# Patient Record
Sex: Female | Born: 1970 | Hispanic: No | Marital: Married | Smoking: Never smoker
Health system: Southern US, Community
[De-identification: ages and names within clinical notes are randomized; demographics above are authoritative.]

## PROBLEM LIST (undated history)

## (undated) DIAGNOSIS — E079 Disorder of thyroid, unspecified: Secondary | ICD-10-CM

## (undated) DIAGNOSIS — M8580 Other specified disorders of bone density and structure, unspecified site: Secondary | ICD-10-CM

## (undated) HISTORY — PX: ELBOW FRACTURE SURGERY: SHX616

## (undated) HISTORY — DX: Disorder of thyroid, unspecified: E07.9

## (undated) HISTORY — PX: BREAST SURGERY: SHX581

## (undated) HISTORY — DX: Other specified disorders of bone density and structure, unspecified site: M85.80

---

## 2014-11-18 LAB — HM PAP SMEAR: HM PAP: NEGATIVE

## 2014-11-18 LAB — TSH: TSH: 0.84 u[IU]/mL (ref <=5.90)

## 2014-11-18 LAB — BASIC METABOLIC PANEL: Creatinine: 0.7 mg/dL (ref 0.5–1.1)

## 2015-01-31 ENCOUNTER — Encounter: Payer: Self-pay | Admitting: Family Medicine

## 2015-01-31 ENCOUNTER — Ambulatory Visit (INDEPENDENT_AMBULATORY_CARE_PROVIDER_SITE_OTHER): Payer: Managed Care, Other (non HMO) | Admitting: Family Medicine

## 2015-01-31 VITALS — BP 126/94 | HR 75 | Ht 66.0 in | Wt 181.0 lb

## 2015-01-31 DIAGNOSIS — F418 Other specified anxiety disorders: Secondary | ICD-10-CM

## 2015-01-31 DIAGNOSIS — L600 Ingrowing nail: Secondary | ICD-10-CM | POA: Diagnosis not present

## 2015-01-31 DIAGNOSIS — Z23 Encounter for immunization: Secondary | ICD-10-CM | POA: Diagnosis not present

## 2015-01-31 DIAGNOSIS — F32A Depression, unspecified: Secondary | ICD-10-CM

## 2015-01-31 DIAGNOSIS — R635 Abnormal weight gain: Secondary | ICD-10-CM

## 2015-01-31 DIAGNOSIS — F419 Anxiety disorder, unspecified: Principal | ICD-10-CM

## 2015-01-31 DIAGNOSIS — E039 Hypothyroidism, unspecified: Secondary | ICD-10-CM | POA: Insufficient documentation

## 2015-01-31 DIAGNOSIS — F329 Major depressive disorder, single episode, unspecified: Secondary | ICD-10-CM

## 2015-01-31 MED ORDER — VILAZODONE HCL 10 & 20 MG PO KIT: 1.0000 | PACK | Freq: Every day | ORAL | Status: DC

## 2015-01-31 NOTE — Patient Instructions (Signed)
Thank you for coming in today. I recommend counseling.  Start viibryd.  Return in 2 weeks.  Do a 7 day food log in myfitnesspal and send the results to me.   Generalized Anxiety Disorder Generalized anxiety disorder (GAD) is a mental disorder. It interferes with life functions, including relationships, work, and school. GAD is different from normal anxiety, which everyone experiences at some point in their lives in response to specific life events and activities. Normal anxiety actually helps us prepare for and get through these life events and activities. Normal anxiety goes away after the event or activity is over.  GAD causes anxiety that is not necessarily related to specific events or activities. It also causes excess anxiety in proportion to specific events or activities. The anxiety associated with GAD is also difficult to control. GAD can vary from mild to severe. People with severe GAD can have intense waves of anxiety with physical symptoms (panic attacks).  SYMPTOMS The anxiety and worry associated with GAD are difficult to control. This anxiety and worry are related to many life events and activities and also occur more days than not for 6 months or longer. People with GAD also have three or more of the following symptoms (one or more in children):  Restlessness.   Fatigue.  Difficulty concentrating.   Irritability.  Muscle tension.  Difficulty sleeping or unsatisfying sleep. DIAGNOSIS GAD is diagnosed through an assessment by your health care provider. Your health care provider will ask you questions aboutyour mood,physical symptoms, and events in your life. Your health care provider may ask you about your medical history and use of alcohol or drugs, including prescription medicines. Your health care provider may also do a physical exam and blood tests. Certain medical conditions and the use of certain substances can cause symptoms similar to those associated with GAD. Your  health care provider may refer you to a mental health specialist for further evaluation. TREATMENT The following therapies are usually used to treat GAD:   Medication. Antidepressant medication usually is prescribed for long-term daily control. Antianxiety medicines may be added in severe cases, especially when panic attacks occur.   Talk therapy (psychotherapy). Certain types of talk therapy can be helpful in treating GAD by providing support, education, and guidance. A form of talk therapy called cognitive behavioral therapy can teach you healthy ways to think about and react to daily life events and activities.  Stress managementtechniques. These include yoga, meditation, and exercise and can be very helpful when they are practiced regularly. A mental health specialist can help determine which treatment is best for you. Some people see improvement with one therapy. However, other people require a combination of therapies.   This information is not intended to replace advice given to you by your health care provider. Make sure you discuss any questions you have with your health care provider.   Document Released: 07/20/2012 Document Revised: 04/15/2014 Document Reviewed: 07/20/2012 Elsevier Interactive Patient Education [employer]2016 Elsevier Inc.

## 2015-01-31 NOTE — Assessment & Plan Note (Signed)
Discuss diet and exercise. Bring food log.

## 2015-01-31 NOTE — Progress Notes (Signed)
Christy Burns is a 44 y.o. female who presents to [place]  today for establish care and discussed anxiety, ingrown toenail, weight gain, and foot wound..  1) anxiety: Patient has had anxiety intermittently for years that was mildly present and controlled easily with no treatment. However she's had increased stress at work. She was part of the [employer] team and was laid off when [employer] [facility] bought off of the [employer] [facility]. She has a new employer. Over the past several months to years she's had worsening anxiety. Her previous PCP history her with Lexapro, Topamax, Zoloft, Effexor, Prozac and many other medications which she is not able to tolerate. She has not had Viibryd or Brintellix. She denies any suicidality or homicidality. She has not attended counseling.  2)  ingrown toenail: Patient notes a history of left ingrown great toenails especially on the lateral nail fold. It is not specifically tender inflamed today. She would like it possibly removed.  3) weight gain: Patient notes a history of weight gain over the past few years from 150-180 pounds. She sometimes keeps track of her calories. She does not have time to exercise.  4) stepped on a stable: Patient stepped on a stable on her left foot a few days ago. She notes the wound is quite well and nontender. She states that she is due for a tetanus vaccine.  Patient has a history of hypothyroidism. She notes her TSH was recently checked and normal. Past Medical History  Diagnosis Date  . Thyroid disease    Past Surgical History  Procedure Laterality Date  . Breast surgery    . Elbow fracture surgery     Social History  Substance Use Topics  . Smoking status: Never Smoker   . Smokeless tobacco: Not on file  . Alcohol Use: No   family history includes Depression in her mother; Diabetes in her father; Hearing loss in her maternal grandfather; Hyperlipidemia in her father; Hypertension in her  father.  ROS as above Medications: Current Outpatient Prescriptions  Medication Sig Dispense Refill  . levothyroxine (SYNTHROID, LEVOTHROID) 100 MCG tablet Take 100 mcg by mouth daily.  5  . Vilazodone HCl (VIIBRYD STARTER PACK) 10 & 20 MG KIT Take 1 tablet by mouth daily. Use as directed, 4 weeks QS 1 kit 0   No current facility-administered medications for this visit.   Not on File   Exam:  BP 126/94 mmHg  Pulse 75  Ht 5' 6"  (1.676 m)  Wt 181 lb (82.101 kg)  BMI 29.23 kg/m2 Gen: Well NAD HEENT: EOMI,  MMM Lungs: Normal work of breathing. CTABL Heart: RRR no MRG Abd: NABS, Soft. Nondistended, Nontender Exts: Brisk capillary refill, warm and well perfused.  Left foot ingrown non-infected great toenail with onychomycosis present. Onychomycosis  is present medially in the ingrown portion is present on the medial side. Nontender. The plantar aspect of foot shows a mildly erythematous papule nontender with no surrounding induration or erythema. Psych: Alert and oriented normal fracture patient thought process. PHQ 7 is 18.  PHQ9 is 14.  No results found for this or any previous visit (from the past 24 hour(s)). No results found.   Please see individual assessment and plan sections.  Tdap given prior to discharge.

## 2015-01-31 NOTE — Assessment & Plan Note (Signed)
Symptoms consistent with anxiety and depression. Patient has had treatment failure or side effects with multiple SSRIs and numbness in her eyes. Will try Viibryd. Starter pack. Recheck 2 weeks.

## 2015-01-31 NOTE — Assessment & Plan Note (Signed)
Obtain medical records to review TSH. Continue current regimen.

## 2015-01-31 NOTE — Assessment & Plan Note (Signed)
Return for partial nail removal when necessary

## 2015-02-09 ENCOUNTER — Encounter: Payer: Self-pay | Admitting: Family Medicine

## 2015-02-23 ENCOUNTER — Telehealth: Payer: Self-pay | Admitting: Family Medicine

## 2015-02-23 NOTE — Telephone Encounter (Signed)
Received fax for prior authorization on Viibryd filled out form and [facility]. - CF

## 2015-03-20 ENCOUNTER — Emergency Department (INDEPENDENT_AMBULATORY_CARE_PROVIDER_SITE_OTHER)
Admission: EM | Admit: 2015-03-20 | Discharge: 2015-03-20 | Disposition: A | Discharge status: Home / Self Care | Payer: Managed Care, Other (non HMO) | Source: Home / Self Care | Attending: Family Medicine | Admitting: Family Medicine

## 2015-03-20 ENCOUNTER — Encounter: Payer: Self-pay | Admitting: Emergency Medicine

## 2015-03-20 DIAGNOSIS — H6504 Acute serous otitis media, recurrent, right ear: Secondary | ICD-10-CM | POA: Diagnosis not present

## 2015-03-20 MED ORDER — AMOXICILLIN 875 MG PO TABS: 875.0000 mg | ORAL_TABLET | Freq: Two times a day (BID) | ORAL | Status: DC

## 2015-03-20 MED ORDER — PREDNISONE 20 MG PO TABS: ORAL_TABLET | ORAL | Status: DC

## 2015-03-20 NOTE — ED Provider Notes (Signed)
CSN: [id number]     Arrival date & time 03/20/15  1713 History   First MD Initiated Contact with Patient 03/20/15 1719     Chief Complaint  Patient presents with  . Ear Problem   (Consider location/radiation/quality/duration/timing/severity/associated sxs/prior Treatment) HPI Pt is a 44yo female with hx of recurrent ear infections, presenting to [facility] with c/o Right sided ear pain and "fullness" that stared 4 days ago, associated nasal congestion, sore throat, and mild intermittent non-productive cough.  Pt states she has lots of scarring in her Right ear and it gets infected often. Last episode of AOM in her Right ear was about 6 months ago.  Pt states amoxicillin and prednisone have worked well in the past. She did not have PE tubes in her ears as a child and has not f/u with ENT as an adult. She does use Flonase daily. Denies sick contacts or recent travel. Denies fever or chills.   Past Medical History  Diagnosis Date  . Thyroid disease    Past Surgical History  Procedure Laterality Date  . Breast surgery    . Elbow fracture surgery     Family History  Problem Relation Age of Onset  . Depression Mother   . Diabetes Father   . Hyperlipidemia Father   . Hypertension Father   . Hearing loss Maternal Grandfather    Social History  Substance Use Topics  . Smoking status: Never Smoker   . Smokeless tobacco: None  . Alcohol Use: No   OB History    No data available     Review of Systems  Constitutional: Negative for fever and chills.  HENT: Positive for congestion, ear pain (right) and sore throat. Negative for trouble swallowing and voice change.   Respiratory: Positive for cough. Negative for shortness of breath.   Cardiovascular: Negative for chest pain and palpitations.  Gastrointestinal: Positive for nausea. Negative for vomiting, abdominal pain and diarrhea.  Musculoskeletal: Negative for myalgias, back pain and arthralgias.  Skin: Negative for rash.  Neurological:  Positive for headaches. Negative for dizziness and light-headedness.  All other systems reviewed and are negative.   Allergies  Review of patient's allergies indicates no known allergies.  Home Medications   Prior to Admission medications   Medication Sig Start Date End Date Taking? Authorizing Provider  amoxicillin (AMOXIL) 875 MG tablet Take 1 tablet (875 mg total) by mouth 2 (two) times daily. For 10 days 03/20/15   Noland Fordyce, PA-C  levothyroxine (SYNTHROID, LEVOTHROID) 100 MCG tablet Take 100 mcg by mouth daily. 12/25/14   Historical Provider, MD  predniSONE (DELTASONE) 20 MG tablet 3 tabs po day one, then 2 po daily x 4 days 03/20/15   Noland Fordyce, PA-C  Vilazodone HCl (VIIBRYD STARTER PACK) 10 & 20 MG KIT Take 1 tablet by mouth daily. Use as directed, 4 weeks QS 01/31/15   Gregor Hams, MD   Meds Ordered and Administered this Visit  Medications - No data to display  BP 110/77 mmHg  Pulse 69  Temp(Src) 98 F (36.7 C) (Oral)  Ht 5' 7"  (1.702 m)  Wt 184 lb (83.462 kg)  BMI 28.81 kg/m2  SpO2 97% No data found.   Physical Exam  Constitutional: She appears well-developed and well-nourished. No distress.  HENT:  Head: Normocephalic and atraumatic.  Right Ear: Hearing and ear canal normal. There is tenderness. Tympanic membrane is scarred, erythematous and bulging. A middle ear effusion is present.  Left Ear: Hearing, tympanic membrane, external ear and  ear canal normal.  Nose: Nose normal.  Mouth/Throat: Uvula is midline and mucous membranes are normal. Posterior oropharyngeal erythema present. No oropharyngeal exudate, posterior oropharyngeal edema or tonsillar abscesses.  Eyes: Conjunctivae are normal. No scleral icterus.  Neck: Normal range of motion. Neck supple.  Cardiovascular: Normal rate, regular rhythm and normal heart sounds.   Pulmonary/Chest: Effort normal and breath sounds normal. No respiratory distress. She has no wheezes. She has no rales. She exhibits no  tenderness.  Abdominal: Soft. She exhibits no distension and no mass. There is no tenderness. There is no rebound and no guarding.  Musculoskeletal: Normal range of motion.  Lymphadenopathy:    She has no cervical adenopathy.  Neurological: She is alert.  Skin: Skin is warm and dry. She is not diaphoretic.  Nursing note and vitals reviewed.   ED Course  Procedures (including critical care time)  Labs Review Labs Reviewed - No data to display  Imaging Review No results found.   MDM   1. Recurrent acute serous otitis media of right ear    Pt c/o Right ear pain. Hx of recurrent infections in same ear.   Exam c/w Right AOM  Rx: amoxicillin 874m BID for 10 days, prednisone   Advised pt to use acetaminophen and ibuprofen as needed for fever and pain. Encouraged rest and fluids. F/u with PCP in 7-10 days if not improving, sooner if worsening. Pt verbalized understanding and agreement with tx plan.     ENoland Fordyce PA-C 03/20/15 [phone]

## 2015-03-20 NOTE — Discharge Instructions (Signed)
You may take 400-600mg Ibuprofen (Motrin) every 6-8 hours for fever and pain  °Alternate with Tylenol  °You may take 500mg Tylenol every 4-6 hours as needed for fever and pain  °Follow-up with your primary care provider next week for recheck of symptoms if not improving.  °Be sure to drink plenty of fluids and rest, at least 8hrs of sleep a night, preferably more while you are sick. °Return urgent care or go to closest ER if you cannot keep down fluids/signs of dehydration, fever not reducing with Tylenol, difficulty breathing/wheezing, stiff neck, worsening condition, or other concerns (see below)  °Please take antibiotics as prescribed and be sure to complete entire course even if you start to feel better to ensure infection does not come back. ° ° °Otitis Media With Effusion °Otitis media with effusion is the presence of fluid in the middle ear. This is a common problem in children, which often follows ear infections. It may be present for weeks or longer after the infection. Unlike an acute ear infection, otitis media with effusion refers only to fluid behind the ear drum and not infection. Children with repeated ear and sinus infections and allergy problems are the most likely to get otitis media with effusion. °CAUSES  °The most frequent cause of the fluid buildup is dysfunction of the eustachian tubes. These are the tubes that drain fluid in the ears to the back of the nose (nasopharynx). °SYMPTOMS  °· The main symptom of this condition is hearing loss. As a result, you or your child may: °¨ Listen to the TV at a loud volume. °¨ Not respond to questions. °¨ Ask "what" often when spoken to. °¨ Mistake or confuse one sound or word for another. °· There may be a sensation of fullness or pressure but usually not pain. °DIAGNOSIS  °· Your health care provider will diagnose this condition by examining you or your child's ears. °· Your health care provider may test the pressure in you or your child's ear with a  tympanometer. °· A hearing test may be conducted if the problem persists. °TREATMENT  °· Treatment depends on the duration and the effects of the effusion. °· Antibiotics, decongestants, nose drops, and cortisone-type drugs (tablets or nasal spray) may not be helpful. °· Children with persistent ear effusions may have delayed language or behavioral problems. Children at risk for developmental delays in hearing, learning, and speech may require referral to a specialist earlier than children not at risk. °· You or your child's health care provider may suggest a referral to an ear, nose, and throat surgeon for treatment. The following may help restore normal hearing: °¨ Drainage of fluid. °¨ Placement of ear tubes (tympanostomy tubes). °¨ Removal of adenoids (adenoidectomy). °HOME CARE INSTRUCTIONS  °· Avoid secondhand smoke. °· Infants who are breastfed are less likely to have this condition. °· Avoid feeding infants while they are lying flat. °· Avoid known environmental allergens. °· Avoid people who are sick. °SEEK MEDICAL CARE IF:  °· Hearing is not better in 3 months. °· Hearing is worse. °· Ear pain. °· Drainage from the ear. °· Dizziness. °MAKE SURE YOU:  °· Understand these instructions. °· Will watch your condition. °· Will get help right away if you are not doing well or get worse. °  °This information is not intended to replace advice given to you by your health care provider. Make sure you discuss any questions you have with your health care provider. °  °Document Released: 05/02/2004 Document   Revised: 04/15/2014 Document Reviewed: 10/20/2012 °Elsevier Interactive Patient Education ©2016 Elsevier Inc. ° °

## 2015-03-20 NOTE — ED Notes (Signed)
Rt Ear clogged, congestion, nausea, headache x 4 days

## 2015-03-21 ENCOUNTER — Ambulatory Visit: Payer: Managed Care, Other (non HMO) | Admitting: Family Medicine

## 2015-03-24 ENCOUNTER — Telehealth: Payer: Self-pay

## 2015-03-24 NOTE — ED Notes (Signed)
Spoke on telephone today.  Pt called first time asking for a 12 day script for prednisone.  Provider- E. Gershon MusselO'Malley gave a 5 day dose when seen on 12/12.  Spoke with provider, and she said for patient to finish antibiotic, that the prednisone supply is a tapered dosage and provider preference.  Called patient back and explained what provider stated, and she became angry and said that we only treat the problem and do not care about the patient.  Nurse stated that per provider, she needed to finish antibiotic, and follow up with PCP if still having problems.  Pt stated that she is unable to get an appointment with provider. Pt called back a second time and spoke with registration, and said she was very upset about meds and that she had called several times today and no one had returned her call.  When I called her back, she said that she had talked with me earlier, and she was looking at her prescriptions, and the provider gave her amoxil, instead of Augmentin.  When I spoke with the provider, in her notes she said that Amoxil and prednisone is what works best for the patients ear infections.  So she prescribed those 2 medications.  The provider also told the patient to follow up with ENT because of the recurrent ear infections.  The patient was very angry and asked to speak to my supervisor.

## 2015-04-09 HISTORY — PX: WRIST SURGERY: SHX841

## 2015-06-19 ENCOUNTER — Ambulatory Visit (INDEPENDENT_AMBULATORY_CARE_PROVIDER_SITE_OTHER): Payer: Managed Care, Other (non HMO)

## 2015-06-19 ENCOUNTER — Encounter: Payer: Self-pay | Admitting: Family Medicine

## 2015-06-19 ENCOUNTER — Ambulatory Visit (INDEPENDENT_AMBULATORY_CARE_PROVIDER_SITE_OTHER): Payer: Managed Care, Other (non HMO) | Admitting: Family Medicine

## 2015-06-19 VITALS — BP 136/87 | HR 82 | Wt 184.0 lb

## 2015-06-19 DIAGNOSIS — M79671 Pain in right foot: Secondary | ICD-10-CM | POA: Diagnosis not present

## 2015-06-19 NOTE — Assessment & Plan Note (Signed)
Possible partial tear of the lateral portion of the plantar fascia or ligament tear or strain. X-ray pending. Plan for cam walker boot weightbearing as tolerated. Recheck in 2 weeks

## 2015-06-19 NOTE — Progress Notes (Signed)
   Christy Burns is a 45 y.o. female who presents [facility] today for right heel pain. Patient was in her normal state of health on Friday 3 days ago. She was doing a high intensity interval training class when she landed on her right foot and felt a pop in the lateral plantar calcaneus area. She notes pain and difficulty bearing weight. She has tried ice rest and ibuprofen which have helped only a little. She denies any radiating pain weakness or numbness fevers or chills.    Past Medical History  Diagnosis Date  . Thyroid disease    Past Surgical History  Procedure Laterality Date  . Breast surgery    . Elbow fracture surgery     Social History  Substance Use Topics  . Smoking status: Never Smoker   . Smokeless tobacco: Not on file  . Alcohol Use: No   family history includes Depression in her mother; Diabetes in her father; Hearing loss in her maternal grandfather; Hyperlipidemia in her father; Hypertension in her father.  ROS:  No headache, visual changes, nausea, vomiting, diarrhea, constipation, dizziness, abdominal pain, skin rash, fevers, chills, night sweats, weight loss, swollen lymph nodes, body aches, joint swelling, muscle aches, chest pain, shortness of breath, mood changes, visual or auditory hallucinations.    Medications: Current Outpatient Prescriptions  Medication Sig Dispense Refill  . levothyroxine (SYNTHROID, LEVOTHROID) 100 MCG tablet Take 100 mcg by mouth daily.  5   No current facility-administered medications for this visit.   No Known Allergies   Exam:  BP 136/87 mmHg  Pulse 82  Wt 184 lb (83.462 kg) General: Well Developed, well nourished, and in no acute distress.  Neuro/Psych: Alert and oriented x3, extra-ocular muscles intact, able to move all 4 extremities, sensation grossly intact. Skin: Warm and dry, no rashes noted.  Respiratory: Not using accessory muscles, speaking in full sentences,  trachea midline.  Cardiovascular: Pulses palpable, no extremity edema. Abdomen: Does not appear distended. MSK: Right foot is normal-appearing without any obvious swelling or ecchymosis. Tender to palpation at the plantar lateral calcaneus and into the cuboid. Nontender at the base of the fifth metatarsal. Foot is otherwise nontender. Normal ankle motion pulses capillary refill and sensation. Antalgic gait  X-ray right foot: No obvious bony abnormalities visualized in the area of tenderness.  No results found for this or any previous visit (from the past 24 hour(s)). No results found.   Please see individual assessment and plan sections.

## 2015-06-19 NOTE — Patient Instructions (Signed)
Thank you for coming in today. Return in 2 weeks.  Use the boot,.  Do not drive while wearing the boot.

## 2015-06-20 NOTE — Progress Notes (Signed)
Quick Note:  Normal, no changes. ______ 

## 2015-07-03 ENCOUNTER — Ambulatory Visit: Payer: Managed Care, Other (non HMO) | Admitting: Family Medicine

## 2015-07-11 ENCOUNTER — Ambulatory Visit: Payer: Managed Care, Other (non HMO) | Admitting: Family Medicine

## 2015-07-27 ENCOUNTER — Encounter: Payer: Self-pay | Admitting: Family Medicine

## 2015-07-27 ENCOUNTER — Ambulatory Visit (INDEPENDENT_AMBULATORY_CARE_PROVIDER_SITE_OTHER): Payer: Managed Care, Other (non HMO) | Admitting: Family Medicine

## 2015-07-27 VITALS — BP 121/82 | HR 92 | Wt 179.0 lb

## 2015-07-27 DIAGNOSIS — M79671 Pain in right foot: Secondary | ICD-10-CM | POA: Diagnosis not present

## 2015-07-27 NOTE — Assessment & Plan Note (Signed)
Improving.  Her eccentric exercises. Continue ibuprofen and icing. I think the left foot and left knee pain is likely due to abnormal gait. Wean to shoe and recheck in about a month

## 2015-07-27 NOTE — Progress Notes (Signed)
       Christy Burns is a 45 y.o. female who [facility] for follow-up right heel pain. Patient was seen several weeks ago for right heel pain thought to be a partial plantar fascia rupture. She's been using a cam walker boot and notes that she is doing much better with minimal pain in her right heel. She has started weaning to a regular shoe. However she notes her left knee and left foot are hurting as well. She is using ibuprofen which helps. She thinks she is compensating use her left leg more because her right leg is hurting.   Past Medical History  Diagnosis Date  . Thyroid disease    Past Surgical History  Procedure Laterality Date  . Breast surgery    . Elbow fracture surgery     Social History  Substance Use Topics  . Smoking status: Never Smoker   . Smokeless tobacco: Not on file  . Alcohol Use: No   family history includes Depression in her mother; Diabetes in her father; Hearing loss in her maternal grandfather; Hyperlipidemia in her father; Hypertension in her father.  ROS as above Medications: Current Outpatient Prescriptions  Medication Sig Dispense Refill  . levothyroxine (SYNTHROID, LEVOTHROID) 100 MCG tablet Take 100 mcg by mouth daily.  5   No current facility-administered medications for this visit.   No Known Allergies   Exam:  BP 121/82 mmHg  Pulse 92  Wt 179 lb (81.194 kg) Gen: Well NAD HEENT: EOMI,  MMM Lungs: Normal work of breathing. CTABL Heart: RRR no MRG Abd: NABS, Soft. Nondistended, Nontender Exts: Brisk capillary refill, warm and well perfused.  Left knee mild effusion normal motion nontender stable ligaments exam Right foot normal appearing minimally tender lateral plantar calcaneus. Normal foot motion.  No results found for this or any previous visit (from the past 24 hour(s)). No results found.   Please see individual  assessment and plan sections.

## 2015-07-27 NOTE — Patient Instructions (Signed)
Thank you for coming in today. Wean out of the boot.  Do the exercises we discussed.  Return sooner if needed.  Return in 4 weeks.

## 2015-08-24 ENCOUNTER — Ambulatory Visit (INDEPENDENT_AMBULATORY_CARE_PROVIDER_SITE_OTHER): Payer: Managed Care, Other (non HMO) | Admitting: Family Medicine

## 2015-08-24 DIAGNOSIS — Z5329 Procedure and treatment not carried out because of patient's decision for other reasons: Secondary | ICD-10-CM

## 2015-08-25 NOTE — Progress Notes (Signed)
No show. Follow up soon.  

## 2015-10-05 ENCOUNTER — Ambulatory Visit (INDEPENDENT_AMBULATORY_CARE_PROVIDER_SITE_OTHER): Payer: Managed Care, Other (non HMO) | Admitting: Family Medicine

## 2015-10-05 ENCOUNTER — Encounter: Payer: Self-pay | Admitting: Family Medicine

## 2015-10-05 VITALS — BP 139/79 | HR 79 | Ht 66.0 in | Wt 171.0 lb

## 2015-10-05 DIAGNOSIS — M79671 Pain in right foot: Secondary | ICD-10-CM

## 2015-10-05 DIAGNOSIS — Z0000 Encounter for general adult medical examination without abnormal findings: Secondary | ICD-10-CM | POA: Diagnosis not present

## 2015-10-05 DIAGNOSIS — E039 Hypothyroidism, unspecified: Secondary | ICD-10-CM | POA: Diagnosis not present

## 2015-10-05 NOTE — Patient Instructions (Signed)
Thank you for coming in today. Get fasting labs soon,.  Return for orthotics.

## 2015-10-05 NOTE — Progress Notes (Signed)
       Christy Burns is a 45 y.o. female who [facility] today for well visit. Patient is doing quite well and aside from her foot pain is essentially asymptomatic. She receives her gynecologic care via OB/GYN. Her cervical cancer screening is up-to-date. She has not yet had breast cancer screening because she has breast implants and the right breast implant is leaking. She's been told not to have mammograms until she can get it fixed which will cost around $7000.  She notes that her hypothyroidism is doing well. She takes levothyroxine daily and denies feeling too hot or too cold or skin changes.  However she notes her feet are still very painful. She's been treated for bilateral plantar fasciitis with home exercise program and icing which has not helped at all.   Past Medical History  Diagnosis Date  . Thyroid disease    Past Surgical History  Procedure Laterality Date  . Breast surgery    . Elbow fracture surgery     Social History  Substance Use Topics  . Smoking status: Never Smoker   . Smokeless tobacco: Not on file  . Alcohol Use: No   family history includes Depression in her mother; Diabetes in her father; Hearing loss in her maternal grandfather; Hyperlipidemia in her father; Hypertension in her father.  ROS as above:  Medications: Current Outpatient Prescriptions  Medication Sig Dispense Refill  . levothyroxine (SYNTHROID, LEVOTHROID) 100 MCG tablet Take 100 mcg by mouth daily.  5  . phentermine 37.5 MG capsule Take 37.5 mg by mouth every morning.     No current facility-administered medications for this visit.   No Known Allergies   Exam:  BP 139/79 mmHg  Pulse 79  Ht 5\' 6"  (1.676 m)  Wt 171 lb (77.565 kg)  BMI 27.61 kg/m2 Gen: Well NAD HEENT: EOMI,  MMM Lungs: Normal work of breathing. CTABL Heart: RRR no MRG Abd: NABS, Soft.  Nondistended, Nontender Exts: Brisk capillary refill, warm and well perfused.  Feet bilaterally normal-appearing. Tender palpation bilateral plantar calcaneus. Antalgic gait.  No results found for this or any previous visit (from the past 24 hour(s)). No results found.    Assessment and Plan: 45 y.o. female with  Will visit: Doing quite well. Cancer screening is up-to-date. We'll research what to do for breast cancer screening for this patient. I suspect breast MRI is probably her best option that this may be limited by insurance.  We'll additionally obtain basic fasting labs and HIV test as part of her wellness visit today.  History of her bilateral plantar fasciitis will have patient return for orthotics in the near future.  Discussed warning signs or symptoms. Please see discharge instructions. Patient expresses understanding.

## 2015-10-19 ENCOUNTER — Ambulatory Visit (INDEPENDENT_AMBULATORY_CARE_PROVIDER_SITE_OTHER): Payer: Managed Care, Other (non HMO) | Admitting: Family Medicine

## 2015-10-19 VITALS — BP 122/83 | HR 94

## 2015-10-19 DIAGNOSIS — M79671 Pain in right foot: Secondary | ICD-10-CM

## 2015-10-19 NOTE — Progress Notes (Signed)
          Orthotics Note:   Patient was fitted for a : standard, cushioned, semi-rigid orthotic. The orthotic was heated and afterward the patient stood on the orthotic blank positioned on the orthotic stand. The patient was positioned in subtalar neutral position and 10 degrees of ankle dorsiflexion in a weight bearing stance. After completion of molding, a stable base was applied to the orthotic blank. The blank was ground to a stable position for weight bearing. Size: 9 left trimmed short to fit in shoe better.  Base: The [employer] EVA Additional Posting and Padding: None The patient ambulated these, and they were very comfortable.  I spent 40 minutes with this patient, greater than 50% was face-to-face time counseling regarding the below diagnosis.

## 2015-10-19 NOTE — Patient Instructions (Signed)
Thank you for coming in today. Return in a few weeks if not better.  Continue the exercises.

## 2015-12-22 LAB — COMPREHENSIVE METABOLIC PANEL
ALK PHOS: 55 U/L (ref 33–115)
ALT: 19 U/L (ref 6–29)
AST: 14 U/L (ref 10–35)
Albumin: 4.5 g/dL (ref 3.6–5.1)
BUN: 14 mg/dL (ref 7–25)
CO2: 26 mmol/L (ref 20–31)
CREATININE: 0.81 mg/dL (ref 0.50–1.10)
Calcium: 9.6 mg/dL (ref 8.6–10.2)
Chloride: 104 mmol/L (ref 98–110)
GLUCOSE: 94 mg/dL (ref 65–99)
POTASSIUM: 4.4 mmol/L (ref 3.5–5.3)
SODIUM: 140 mmol/L (ref 135–146)
TOTAL PROTEIN: 6.9 g/dL (ref 6.1–8.1)
Total Bilirubin: 0.6 mg/dL (ref 0.2–1.2)

## 2015-12-22 LAB — CBC
HCT: 38.6 % (ref 35.0–45.0)
Hemoglobin: 13.3 g/dL (ref 11.7–15.5)
MCH: 30.1 pg (ref 27.0–33.0)
MCHC: 34.5 g/dL (ref 32.0–36.0)
MCV: 87.3 fL (ref 80.0–100.0)
MPV: 11.2 fL (ref 7.5–12.5)
PLATELETS: 262 10*3/uL (ref 140–400)
RBC: 4.42 MIL/uL (ref 3.80–5.10)
RDW: 12.4 % (ref 11.0–15.0)
WBC: 3.9 10*3/uL (ref 3.8–10.8)

## 2015-12-22 LAB — LIPID PANEL
Cholesterol: 210 mg/dL — ABNORMAL HIGH (ref 125–200)
HDL: 78 mg/dL (ref >=46)
LDL CALC: 112 mg/dL (ref <130)
TRIGLYCERIDES: 101 mg/dL (ref <150)
Total CHOL/HDL Ratio: 2.7 Ratio (ref <=5.0)
VLDL: 20 mg/dL (ref <30)

## 2015-12-22 LAB — TSH: TSH: 0.07 mIU/L — ABNORMAL LOW

## 2015-12-23 LAB — HEMOGLOBIN A1C
Hgb A1c MFr Bld: 5.1 % (ref <5.7)
Mean Plasma Glucose: 100 mg/dL

## 2015-12-23 LAB — VITAMIN D 25 HYDROXY (VIT D DEFICIENCY, FRACTURES): Vit D, 25-Hydroxy: 23 ng/mL — ABNORMAL LOW (ref 30–100)

## 2015-12-23 LAB — HIV ANTIBODY (ROUTINE TESTING W REFLEX): HIV: NONREACTIVE

## 2015-12-25 ENCOUNTER — Other Ambulatory Visit: Payer: Self-pay | Admitting: Family Medicine

## 2015-12-25 MED ORDER — LEVOTHYROXINE SODIUM 88 MCG PO TABS: 88.0000 ug | ORAL_TABLET | Freq: Every day | ORAL | 0 refills | Status: DC

## 2015-12-27 ENCOUNTER — Ambulatory Visit (INDEPENDENT_AMBULATORY_CARE_PROVIDER_SITE_OTHER): Payer: Managed Care, Other (non HMO) | Admitting: Family Medicine

## 2015-12-27 ENCOUNTER — Encounter: Payer: Self-pay | Admitting: Family Medicine

## 2015-12-27 VITALS — BP 118/73 | HR 74 | Ht 66.0 in

## 2015-12-27 DIAGNOSIS — R7989 Other specified abnormal findings of blood chemistry: Secondary | ICD-10-CM

## 2015-12-27 DIAGNOSIS — E039 Hypothyroidism, unspecified: Secondary | ICD-10-CM | POA: Diagnosis not present

## 2015-12-27 DIAGNOSIS — H6981 Other specified disorders of Eustachian tube, right ear: Secondary | ICD-10-CM

## 2015-12-27 DIAGNOSIS — R946 Abnormal results of thyroid function studies: Secondary | ICD-10-CM

## 2015-12-27 DIAGNOSIS — M79671 Pain in right foot: Secondary | ICD-10-CM

## 2015-12-27 MED ORDER — FLUTICASONE PROPIONATE 50 MCG/ACT NA SUSP: 2.0000 | Freq: Every day | NASAL | 2 refills | Status: AC

## 2015-12-27 MED ORDER — LORATADINE 10 MG PO TABS: 10.0000 mg | ORAL_TABLET | Freq: Every day | ORAL | 3 refills | Status: DC

## 2015-12-27 NOTE — Patient Instructions (Signed)
Thank you for coming in today. Follow up with podiatry for orthotics. Get repeat labs.  Return as needed.  Use flonase and Claritin + D   Barotitis Media Barotitis media is inflammation of your middle ear. This occurs when the auditory tube (eustachian tube) leading from the back of your nose (nasopharynx) to your eardrum is blocked. This blockage may result from a cold, environmental allergies, or an upper respiratory infection. Unresolved barotitis media may lead to damage or hearing loss (barotrauma), which may become permanent. HOME CARE INSTRUCTIONS   Use medicines as recommended by your health care provider. Over-the-counter medicines will help unblock the canal and can help during times of air travel.  Do not put anything into your ears to clean or unplug them. Eardrops will not be helpful.  Do not swim, dive, or fly until your health care provider says it is all right to do so. If these activities are necessary, chewing gum with frequent, forceful swallowing may help. It is also helpful to hold your nose and gently blow to pop your ears for equalizing pressure changes. This forces air into the eustachian tube.  Only take over-the-counter or prescription medicines for pain, discomfort, or fever as directed by your health care provider.  A decongestant may be helpful in decongesting the middle ear and make pressure equalization easier. SEEK MEDICAL CARE IF:  You experience a serious form of dizziness in which you feel as if the room is spinning and you feel nauseated (vertigo).  Your symptoms only involve one ear. SEEK IMMEDIATE MEDICAL CARE IF:   You develop a severe headache, dizziness, or severe ear pain.  You have bloody or pus-like drainage from your ears.  You develop a fever.  Your problems do not improve or become worse. MAKE SURE YOU:   Understand these instructions.  Will watch your condition.  Will get help right away if you are not doing well or get worse.   This information is not intended to replace advice given to you by your health care provider. Make sure you discuss any questions you have with your health care provider.   Document Released: 03/22/2000 Document Revised: 01/13/2013 Document Reviewed: 10/20/2012 Elsevier Interactive Patient Education [employer] Elsevier Inc.

## 2015-12-27 NOTE — Progress Notes (Signed)
       Christy Burns is a 45 y.o. female who [facility] today for follow-up foot pain. Patient was seen a few months ago for left posterior calcaneus pain. This is due to Achilles tendinitis. She's done well with exercises however a pair of custom semirigid orthotics were made 2 months ago. She notes they are not comfortable. She would like a new pair made if possible.   Additionally patient had recent labs as part of a biometric screening for work. She has a history of hypothyroidism and currently takes levothyroxine 88 g daily. Her TSH was low. She feels well. She does not feel too hot or too cold fevers chills nausea vomiting diarrhea. She denies any hair or skin changes.  Past Medical History:  Diagnosis Date  . Thyroid disease    Past Surgical History:  Procedure Laterality Date  . BREAST SURGERY    . ELBOW FRACTURE SURGERY     Social History  Substance Use Topics  . Smoking status: Never Smoker  . Smokeless tobacco: Not on file  . Alcohol use No   family history includes Depression in her mother; Diabetes in her father; Hearing loss in her maternal grandfather; Hyperlipidemia in her father; Hypertension in her father.  ROS as above:  Medications: Current Outpatient Prescriptions  Medication Sig Dispense Refill  . levothyroxine (SYNTHROID, LEVOTHROID) 88 MCG tablet Take 1 tablet (88 mcg total) by mouth daily. 90 tablet 0  . phentermine 37.5 MG capsule Take 37.5 mg by mouth every morning.    . fluticasone (FLONASE) 50 MCG/ACT nasal spray Place 2 sprays into both nostrils daily. 16 g 2  . loratadine (CLARITIN) 10 MG tablet Take 1 tablet (10 mg total) by mouth daily. 90 tablet 3   No current facility-administered medications for this visit.    No Known Allergies   Exam:  BP 118/73   Pulse 74   Ht 5\' 6"  (1.676 m)  Gen: Well NAD HEENT: EOMI,  MMM  No goiter Lungs: Normal work of breathing. CTABL Heart: RRR no MRG Abd: NABS, Soft. Nondistended, Nontender Exts: Brisk capillary refill, warm and well perfused.  Left foot is unremarkable appearing. Mildly tender palpation posterior calcaneus.  Lab Results  Component Value Date   TSH 0.07 (L) 12/22/2015     No results found for this or any previous visit (from the past 24 hour(s)). No results found.    Assessment and Plan: 45 y.o. female with left posterior ankle pain likely due to insertional Achilles tendinitis.  The orthotics appear to be well made. I suspect she simply does not tolerate the style of orthotics that I make. I recommended she follow-up with podiatry to see if a different orthotic technique may be helpful.  Additionally she has decreased TSH. We'll recheck this level along with free T3 and free T4. If still low recommend decreasing levothyroxine.   Orders Placed This Encounter  Procedures  . T4, free  . T3, free  . TSH  . Ambulatory referral to Podiatry    Referral Priority:   Routine    Referral Type:   Consultation    Referral Reason:   Specialty Services Required    Requested Specialty:   Podiatry    Number of Visits Requested:   1    Discussed warning signs or symptoms. Please see discharge instructions. Patient expresses understanding.

## 2016-01-01 ENCOUNTER — Encounter: Payer: Self-pay | Admitting: Family Medicine

## 2016-01-01 MED ORDER — PREDNISONE 5 MG (48) PO TBPK: ORAL_TABLET | ORAL | 0 refills | Status: DC

## 2016-01-22 ENCOUNTER — Encounter: Payer: Self-pay | Admitting: Family Medicine

## 2016-01-29 ENCOUNTER — Encounter: Payer: Self-pay | Admitting: Podiatry

## 2016-01-29 ENCOUNTER — Ambulatory Visit (INDEPENDENT_AMBULATORY_CARE_PROVIDER_SITE_OTHER): Payer: Managed Care, Other (non HMO) | Admitting: Podiatry

## 2016-01-29 ENCOUNTER — Ambulatory Visit: Payer: Self-pay | Admitting: Podiatry

## 2016-01-29 VITALS — BP 136/100 | HR 79 | Ht 67.0 in | Wt 169.0 lb

## 2016-01-29 DIAGNOSIS — M722 Plantar fascial fibromatosis: Secondary | ICD-10-CM | POA: Diagnosis not present

## 2016-01-29 DIAGNOSIS — M21969 Unspecified acquired deformity of unspecified lower leg: Secondary | ICD-10-CM

## 2016-01-29 DIAGNOSIS — M216X1 Other acquired deformities of right foot: Secondary | ICD-10-CM | POA: Diagnosis not present

## 2016-01-29 DIAGNOSIS — M216X2 Other acquired deformities of left foot: Secondary | ICD-10-CM | POA: Diagnosis not present

## 2016-01-29 NOTE — Progress Notes (Signed)
SUBJECTIVE: 45 y.o. year old female presents stating painful heels right > left since March 2017.  Been using Ibuprofen, icing, stretching, and orthotics. She is not wearing orthotics since they are not comfortable for her.  Works at [employer] one day a week. 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  Pain is at plantar center heel and feels tendons are pulling back to the back of ankl area. R>L. Daily exercise for one hour.  Patient is referred by Dr. Denyse Amassorey.   REVIEW OF SYSTEMS: Pertinent items noted in HPI and remainder of comprehensive ROS otherwise negative.  OBJECTIVE: DERMATOLOGIC EXAMINATION: No abnormal skin lesions noted.   VASCULAR EXAMINATION OF LOWER LIMBS: All pedal pulses are palpable with normal pulsation.  No edema or erythema noted. Temperature gradient from tibial crest to dorsum of foot is within normal bilateral.  NEUROLOGIC EXAMINATION OF THE LOWER LIMBS: All epicritic and tactile sensations grossly intact.   MUSCULOSKELETAL EXAMINATION: Ligamentous laxity bilateral. Positive for excess sagittal plane motion of the first ray bilateral. Elevated first ray upon loading of forefoot. Excess STJ pronation upon weight bearing.  ASSESSMENT: Plantar fasciitis R>L. Ligamentous laxity bilateral lower limb. Elevated and Hypermobile first metatarsal bilateral. STJ excess pronation and forefoot abduction with weight bearing bilateral.   PLAN: Reviewed clinical findings, biomechanical aspect of foot function, and available treatment options, NSAIA, exercise, proper shoe gear, metatarsal binder, Night Splint, custom orthotics, and surgical options.  Metatarsal binder x 2 dispensed to provide added stability of the first ray.  Both feet casted for orthotics. Night splint dispensed to use at night.  Will call when they are ready.

## 2016-01-29 NOTE — Patient Instructions (Addendum)
Seen for heel pain R>L. Reviewed biomechanical aspect of foot function.  Metatarsal binder dispensed.  Both feet casted for orthotics. Night splint dispensed to use at night.  Will call when they are ready.

## 2016-03-12 ENCOUNTER — Ambulatory Visit (INDEPENDENT_AMBULATORY_CARE_PROVIDER_SITE_OTHER): Payer: Managed Care, Other (non HMO) | Admitting: Podiatry

## 2016-03-12 ENCOUNTER — Encounter: Payer: Self-pay | Admitting: Podiatry

## 2016-03-12 DIAGNOSIS — M722 Plantar fascial fibromatosis: Secondary | ICD-10-CM

## 2016-03-12 NOTE — Progress Notes (Signed)
One month orthotic check. Heel area cause to pitches forward. Arches are not setting in right spot. No feeling of supporting to arch area. None of shoes are fitting or accommodating. Wants to wear regular tennis shoes.

## 2016-03-12 NOTE — Patient Instructions (Signed)
Orthotics are not working. Need to make adjustment. Will call when they are ready.

## 2016-04-02 ENCOUNTER — Ambulatory Visit: Payer: Managed Care, Other (non HMO) | Admitting: Podiatry

## 2016-05-12 ENCOUNTER — Other Ambulatory Visit: Payer: Self-pay | Admitting: Family Medicine

## 2016-05-13 ENCOUNTER — Ambulatory Visit: Payer: Managed Care, Other (non HMO) | Admitting: Podiatry

## 2016-05-16 ENCOUNTER — Ambulatory Visit (INDEPENDENT_AMBULATORY_CARE_PROVIDER_SITE_OTHER): Payer: Managed Care, Other (non HMO) | Admitting: Family Medicine

## 2016-05-16 VITALS — BP 114/67 | HR 94 | Temp 98.0°F | Wt 173.0 lb

## 2016-05-16 DIAGNOSIS — J069 Acute upper respiratory infection, unspecified: Secondary | ICD-10-CM

## 2016-05-16 DIAGNOSIS — H6691 Otitis media, unspecified, right ear: Secondary | ICD-10-CM | POA: Diagnosis not present

## 2016-05-16 DIAGNOSIS — B9789 Other viral agents as the cause of diseases classified elsewhere: Secondary | ICD-10-CM | POA: Diagnosis not present

## 2016-05-16 MED ORDER — AMOXICILLIN-POT CLAVULANATE 875-125 MG PO TABS
1.0000 | ORAL_TABLET | Freq: Two times a day (BID) | ORAL | 0 refills | Status: DC
Start: 2016-05-16 — End: 2016-12-24

## 2016-05-16 MED ORDER — PREDNISONE 10 MG PO TABS: 30.0000 mg | ORAL_TABLET | Freq: Every day | ORAL | 0 refills | Status: DC

## 2016-05-16 MED ORDER — BENZONATATE 100 MG PO CAPS: 100.0000 mg | ORAL_CAPSULE | Freq: Three times a day (TID) | ORAL | 1 refills | Status: DC | PRN

## 2016-05-16 MED ORDER — GUAIFENESIN-CODEINE 100-10 MG/5ML PO SOLN: 5.0000 mL | Freq: Every evening | ORAL | 0 refills | Status: DC | PRN

## 2016-05-16 NOTE — Patient Instructions (Signed)
Thank you for coming in today. Use cough medicine as needed.  Take prednisone and Augmentin if worsening.  Return as needed.  Call or go to the emergency room if you get worse, have trouble breathing, have chest pains, or palpitations.    Cough, Adult Introduction A cough helps to clear your throat and lungs. A cough may last only 2-3 weeks (acute), or it may last longer than 8 weeks (chronic). Many different things can cause a cough. A cough may be a sign of an illness or another medical condition. Follow these instructions at home:  Pay attention to any changes in your cough.  Take medicines only as told by your doctor.  If you were prescribed an antibiotic medicine, take it as told by your doctor. Do not stop taking it even if you start to feel better.  Talk with your doctor before you try using a cough medicine.  Drink enough fluid to keep your pee (urine) clear or pale yellow.  If the air is dry, use a cold steam vaporizer or humidifier in your home.  Stay away from things that make you cough at work or at home.  If your cough is worse at night, try using extra pillows to raise your head up higher while you sleep.  Do not smoke, and try not to be around smoke. If you need help quitting, ask your doctor.  Do not have caffeine.  Do not drink alcohol.  Rest as needed. Contact a doctor if:  You have new problems (symptoms).  You cough up yellow fluid (pus).  Your cough does not get better after 2-3 weeks, or your cough gets worse.  Medicine does not help your cough and you are not sleeping well.  You have pain that gets worse or pain that is not helped with medicine.  You have a fever.  You are losing weight and you do not know why.  You have night sweats. Get help right away if:  You cough up blood.  You have trouble breathing.  Your heartbeat is very fast. This information is not intended to replace advice given to you by your health care provider. Make  sure you discuss any questions you have with your health care provider. Document Released: 12/06/2010 Document Revised: 08/31/2015 Document Reviewed: 06/01/2014  2017 Elsevier   Barotitis Media Barotitis media is inflammation of your middle ear. This occurs when the auditory tube (eustachian tube) leading from the back of your nose (nasopharynx) to your eardrum is blocked. This blockage may result from a cold, environmental allergies, or an upper respiratory infection. Unresolved barotitis media may lead to damage or hearing loss (barotrauma), which may become permanent. HOME CARE INSTRUCTIONS   Use medicines as recommended by your health care provider. Over-the-counter medicines will help unblock the canal and can help during times of air travel.  Do not put anything into your ears to clean or unplug them. Eardrops will not be helpful.  Do not swim, dive, or fly until your health care provider says it is all right to do so. If these activities are necessary, chewing gum with frequent, forceful swallowing may help. It is also helpful to hold your nose and gently blow to pop your ears for equalizing pressure changes. This forces air into the eustachian tube.  Only take over-the-counter or prescription medicines for pain, discomfort, or fever as directed by your health care provider.  A decongestant may be helpful in decongesting the middle ear and make pressure equalization easier.  SEEK MEDICAL CARE IF:  You experience a serious form of dizziness in which you feel as if the room is spinning and you feel nauseated (vertigo).  Your symptoms only involve one ear. SEEK IMMEDIATE MEDICAL CARE IF:   You develop a severe headache, dizziness, or severe ear pain.  You have bloody or pus-like drainage from your ears.  You develop a fever.  Your problems do not improve or become worse. MAKE SURE YOU:   Understand these instructions.  Will watch your condition.  Will get help right away if  you are not doing well or get worse. This information is not intended to replace advice given to you by your health care provider. Make sure you discuss any questions you have with your health care provider. Document Released: 03/22/2000 Document Revised: 01/13/2013 Document Reviewed: 10/20/2012 Elsevier Interactive Patient Education  2017 [employer]

## 2016-05-16 NOTE — Progress Notes (Signed)
Christy PoliceKimberly Burns is a 46 y.o. female who [facility] today for cough and ear fullness.  She has had a dry cough for the past 3 days that became productive with yellow sputum yesterday. Cough is worse at night and associated with chest discomfort. She has also had 1 day of right ear fullness and pain. No fevers, sinus pain or congestion, nausea, vomiting, or diarrhea. Her son was sick with a cough last week. She has had 30-40 ear infections in the past and has improve with a variety of antibiotics, most recently Augmentin last fall, as well as prednisone. She feels that ear fullness in the setting of a viral infection often precedes her ear infection. She does not want to see an ENT surgeon as she wants to avoid surgical intervention.   Past Medical History:  Diagnosis Date  . Thyroid disease    Past Surgical History:  Procedure Laterality Date  . BREAST SURGERY    . ELBOW FRACTURE SURGERY     Social History  Substance Use Topics  . Smoking status: Never Smoker  . Smokeless tobacco: Not on file  . Alcohol use No   family history includes Depression in her mother; Diabetes in her father; Hearing loss in her maternal grandfather; Hyperlipidemia in her father; Hypertension in her father.  ROS as above:  Medications: Current Outpatient Prescriptions  Medication Sig Dispense Refill  . amoxicillin-clavulanate (AUGMENTIN) 875-125 MG tablet Take 1 tablet by mouth 2 (two) times daily. 20 tablet 0  . benzonatate (TESSALON) 100 MG capsule Take 1 capsule (100 mg total) by mouth 3 (three) times daily as needed for cough. 40 capsule 1  . fluticasone (FLONASE) 50 MCG/ACT nasal spray Place 2 sprays into both nostrils daily. 16 g 2  . guaiFENesin-codeine 100-10 MG/5ML syrup Take 5 mLs by mouth at bedtime as needed for cough. 120 mL 0  . levothyroxine (SYNTHROID,  LEVOTHROID) 88 MCG tablet TAKE 1 TABLET BY MOUTH EVERY DAY 90 tablet 0  . loratadine (CLARITIN) 10 MG tablet Take 1 tablet (10 mg total) by mouth daily. 90 tablet 3  . phentermine 37.5 MG capsule Take 37.5 mg by mouth every morning.    . predniSONE (DELTASONE) 10 MG tablet Take 3 tablets (30 mg total) by mouth daily with breakfast. 15 tablet 0  . predniSONE (STERAPRED UNI-PAK 48 TAB) 5 MG (48) TBPK tablet 12 day dosepack po 48 tablet 0   No current facility-administered medications for this visit.    No Known Allergies  Health Maintenance Health Maintenance  Topic Date Due  . PAP SMEAR  11/17/2017  . TETANUS/TDAP  01/30/2025  . INFLUENZA VACCINE  Completed  . HIV Screening  Completed     Exam:  BP 114/67 (BP Location: Right Arm, Patient Position: Sitting, Cuff Size: Normal)   Pulse 94   Temp 98 F (36.7 C) (Oral)   Wt 173 lb (78.5 kg)   SpO2 99%   BMI 27.10 kg/m  Gen: Well NAD HEENT: EOMI,  MMM, oropharynx without edema or exudates, TMs normal-appearing bilaterally, no cervical lymphadenopathy Lungs: Normal work of breathing. CTABL Heart: RRR no MRG Abd: NABS, Soft. Nondistended, Nontender Exts: Brisk capillary refill, warm and well perfused.    No results found for this or any previous visit (from the past 72 hour(s)). No results found.    Assessment and Plan: 46 y.o. female with cough and right otitis. Although her right TM appears  normal today, her extensive history of ear infections and likely recent viral URI make it possible that she may be developing one. - Tessalon and codeine syrup for cough - Augmentin and prednisone if ear pain persists.   No orders of the defined types were placed in this encounter.  Meds ordered this encounter  Medications  . benzonatate (TESSALON) 100 MG capsule    Sig: Take 1 capsule (100 mg total) by mouth 3 (three) times daily as needed for cough.    Dispense:  40 capsule    Refill:  1  . guaiFENesin-codeine 100-10 MG/5ML syrup     Sig: Take 5 mLs by mouth at bedtime as needed for cough.    Dispense:  120 mL    Refill:  0  . amoxicillin-clavulanate (AUGMENTIN) 875-125 MG tablet    Sig: Take 1 tablet by mouth 2 (two) times daily.    Dispense:  20 tablet    Refill:  0  . predniSONE (DELTASONE) 10 MG tablet    Sig: Take 3 tablets (30 mg total) by mouth daily with breakfast.    Dispense:  15 tablet    Refill:  0     Discussed warning signs or symptoms. Please see discharge instructions. Patient expresses understanding.

## 2016-05-16 NOTE — Progress Notes (Signed)
Pt started coughing Monday, has been coughing to the point of her chest hurting every time she coughs.  Yesterday her right ear started feeling full.

## 2016-05-20 ENCOUNTER — Ambulatory Visit: Payer: Managed Care, Other (non HMO) | Admitting: Podiatry

## 2016-06-03 ENCOUNTER — Ambulatory Visit (INDEPENDENT_AMBULATORY_CARE_PROVIDER_SITE_OTHER): Payer: Managed Care, Other (non HMO) | Admitting: Podiatry

## 2016-06-03 ENCOUNTER — Encounter: Payer: Self-pay | Admitting: Podiatry

## 2016-06-03 DIAGNOSIS — M722 Plantar fascial fibromatosis: Secondary | ICD-10-CM

## 2016-06-03 DIAGNOSIS — M21969 Unspecified acquired deformity of unspecified lower leg: Secondary | ICD-10-CM

## 2016-06-03 NOTE — Progress Notes (Signed)
On 03/12/14 wrote during one month orthotic check. Heel area cause to pitches forward. Arches are not setting in right spot. No feeling of supporting to arch area. None of shoes are fitting or accommodating. Wants to wear regular tennis shoes.  Today, 06/03/16 patient brought back adjusted orthotics saying the orthotic top layer is causing suction and make noise. Will return the orthotics and replace the top layer with Spenco. Also will dispense hard shell OTC orthotics when she pick up the adjusted pair.

## 2016-06-03 NOTE — Patient Instructions (Signed)
Need to replace top layer with Spenco material.

## 2016-10-10 ENCOUNTER — Ambulatory Visit (INDEPENDENT_AMBULATORY_CARE_PROVIDER_SITE_OTHER): Payer: Managed Care, Other (non HMO)

## 2016-10-10 ENCOUNTER — Ambulatory Visit (INDEPENDENT_AMBULATORY_CARE_PROVIDER_SITE_OTHER): Payer: Managed Care, Other (non HMO) | Admitting: Family Medicine

## 2016-10-10 VITALS — BP 111/68 | HR 73 | Temp 98.3°F | Wt 180.0 lb

## 2016-10-10 DIAGNOSIS — X58XXXA Exposure to other specified factors, initial encounter: Secondary | ICD-10-CM

## 2016-10-10 DIAGNOSIS — S6992XA Unspecified injury of left wrist, hand and finger(s), initial encounter: Secondary | ICD-10-CM | POA: Diagnosis not present

## 2016-10-10 DIAGNOSIS — S60222A Contusion of left hand, initial encounter: Secondary | ICD-10-CM | POA: Diagnosis not present

## 2016-10-10 DIAGNOSIS — T1490XA Injury, unspecified, initial encounter: Secondary | ICD-10-CM

## 2016-10-10 NOTE — Patient Instructions (Signed)
Thank you for coming in today. Use buddy tape.  Continue ice.  Recheck in 2-4 weeks if not getting better.  Hand therapy can help a lot too.    How to Buddy Tape Buddy taping refers to taping an injured finger or toe to an uninjured finger or toe that is next to it. This protects the injured finger or toe and keeps it from moving while the injury heals. You may buddy tape a finger or toe if you have a minor sprain. Your health care provider may buddy tape your finger or toe if you have a sprain, dislocation, or fracture. You may be told to replace your buddy taping as needed. What are the risks? Generally, buddy taping is safe. However, problems may occur, such as:  Skin injury or infection.  Reduced blood flow to the finger or toe.  Skin reaction to the tape.  Do not buddy tape your toe if you have diabetes. Do not buddy tape if you know that you have an allergy to adhesives or surgical tape. How to buddy tape Before Buddy Taping Try to reduce any pain and swelling with rest, icing, and elevation:  Avoid any activity that causes pain.  Raise (elevate) your hand or foot above the level of your heart while you are sitting or lying down.  If directed, apply ice to the injured area: ? Put ice in a plastic bag. ? Place a towel between your skin and the bag. ? Leave the ice on for 20 minutes, 2-3 times per day.  Buddy Taping Procedure  Clean and dry your finger or toe as told by your health care provider.  Place a gauze pad or a piece of cloth or cotton between your injured finger or toe and the uninjured finger or toe.  Use tape to wrap around both fingers or toes so your injured finger or toe is secured to the uninjured finger or toe. ? The tape should be snug, but not tight. ? Make sure the ends of the piece of tape overlap. ? Avoid placing tape directly over the joint.  Change the tape and the padding as told by your health care provider. Remove and replace the tape or  padding if it becomes loose, worn, dirty, or wet. After Buddy Taping  Take over-the-counter and prescription medicines only as told by your health care provider.  Return to your normal activities as told by your health care provider. Ask your health care provider what activities are safe for you.  Watch the buddy-taped area and always remove buddy taping if: ? Your pain gets worse. ? Your fingers turn pale or blue. ? Your skin becomes irritated. Contact a health care provider if:  You have pain, swelling, or bruising that lasts longer than three days.  You have a fever.  Your skin is red, cracked, or irritated. Get help right away if:  The injured area becomes cold, numb, or pale.  You have severe pain, swelling, bruising, or loss of movement in your finger or toe.  Your finger or toe changes shape (deformity). This information is not intended to replace advice given to you by your health care provider. Make sure you discuss any questions you have with your health care provider. Document Released: 05/02/2004 Document Revised: 08/31/2015 Document Reviewed: 08/17/2014 Elsevier Interactive Patient Education  [employer]2018 [employer] Inc.

## 2016-10-11 NOTE — Progress Notes (Signed)
Christy Burns is a 46 y.o. female who presents to [facility] today for left finger injury. Patient at the dorsal aspect of her index finger on an object a day ago. She notes pain and bruising. She denies any radiating pain weakness or numbness. She is worried that she may have broken it. She has not tried much treatment yet. No fevers or chills.   Past Medical History:  Diagnosis Date  . Thyroid disease    Past Surgical History:  Procedure Laterality Date  . BREAST SURGERY    . ELBOW FRACTURE SURGERY     Social History  Substance Use Topics  . Smoking status: Never Smoker  . Smokeless tobacco: Never Used  . Alcohol use No     ROS:  As above   Medications: Current Outpatient Prescriptions  Medication Sig Dispense Refill  . amoxicillin-clavulanate (AUGMENTIN) 875-125 MG tablet Take 1 tablet by mouth 2 (two) times daily. (Patient not taking: Reported on 10/10/2016) 20 tablet 0  . benzonatate (TESSALON) 100 MG capsule Take 1 capsule (100 mg total) by mouth 3 (three) times daily as needed for cough. (Patient not taking: Reported on 10/10/2016) 40 capsule 1  . fluticasone (FLONASE) 50 MCG/ACT nasal spray Place 2 sprays into both nostrils daily. 16 g 2  . guaiFENesin-codeine 100-10 MG/5ML syrup Take 5 mLs by mouth at bedtime as needed for cough. (Patient not taking: Reported on 10/10/2016) 120 mL 0  . levothyroxine (SYNTHROID, LEVOTHROID) 88 MCG tablet TAKE 1 TABLET BY MOUTH EVERY DAY 90 tablet 0  . loratadine (CLARITIN) 10 MG tablet Take 1 tablet (10 mg total) by mouth daily. 90 tablet 3  . phentermine 37.5 MG capsule Take 37.5 mg by mouth every morning.    . predniSONE (DELTASONE) 10 MG tablet Take 3 tablets (30 mg total) by mouth daily with breakfast. (Patient not taking: Reported on 10/10/2016) 15 tablet 0  . predniSONE (STERAPRED UNI-PAK 48 TAB) 5 MG (48) TBPK tablet 12 day dosepack po (Patient not taking: Reported on 10/10/2016) 48 tablet 0    No current facility-administered medications for this visit.    No Known Allergies   Exam:  BP 111/68 (BP Location: Left Arm, Patient Position: Sitting, Cuff Size: Normal)   Pulse 73   Temp 98.3 F (36.8 C) (Oral)   Wt 180 lb (81.6 kg)   SpO2 100%   BMI 28.19 kg/m  General: Well Developed, well nourished, and in no acute distress.  Neuro/Psych: Alert and oriented x3, extra-ocular muscles intact, able to move all 4 extremities, sensation grossly intact. Skin: Warm and dry, no rashes noted.  Respiratory: Not using accessory muscles, speaking in full sentences, trachea midline.  Cardiovascular: Pulses palpable, no extremity edema. Abdomen: Does not appear distended. MSK: Left hand shows swelling and ecchymosis at the dorsal aspect of the second digit at the MCP and proximal phalanx. Motion pulses capillary refill are intact.    No results found for this or any previous visit (from the past 48 hour(s)). Dg Hand Complete Left  Result Date: 10/10/2016 CLINICAL DATA:  Injury. EXAM: LEFT HAND - COMPLETE 3+ VIEW COMPARISON:  No recent . FINDINGS: There is no evidence of fracture or dislocation. There is no evidence of arthropathy or other focal bone abnormality. Soft tissues are unremarkable. IMPRESSION: No acute abnormality identified. Electronically Signed   By: Maisie Fus  Register   On: 10/10/2016 16:30      Assessment and Plan: 46 y.o. female with contusion without fracture. Plan  for relative rest buddy tape and over-the-counter medications for pain. She already has physical therapy of her hand for the right hand symptoms. If her symptoms persist she can add left hand symptoms to the physical therapy.    Orders Placed This Encounter  Procedures  . DG Hand Complete Left    Order Specific Question:   Reason for Exam (SYMPTOM  OR DIAGNOSIS REQUIRED)    Answer:   injury    Order Specific Question:   Is patient pregnant?    Answer:   No    Order Specific Question:   Preferred  imaging location?    [facility]    Order Specific Question:   Radiology Contrast Protocol - do NOT remove file path    Answer:   [email or web address]   No orders of the defined types were placed in this encounter.   Discussed warning signs or symptoms. Please see discharge instructions. Patient expresses understanding.

## 2016-10-31 ENCOUNTER — Encounter: Payer: Managed Care, Other (non HMO) | Admitting: Family Medicine

## 2016-11-13 ENCOUNTER — Encounter: Payer: Managed Care, Other (non HMO) | Admitting: Family Medicine

## 2016-12-16 ENCOUNTER — Telehealth: Payer: Self-pay | Admitting: Family Medicine

## 2016-12-16 DIAGNOSIS — E039 Hypothyroidism, unspecified: Secondary | ICD-10-CM

## 2016-12-16 DIAGNOSIS — R635 Abnormal weight gain: Secondary | ICD-10-CM

## 2016-12-16 DIAGNOSIS — Z0000 Encounter for general adult medical examination without abnormal findings: Secondary | ICD-10-CM

## 2016-12-16 NOTE — Telephone Encounter (Signed)
Patient called getting a cpe on 12/20/16 and would like to get fasting lab order sent down so she can get labs done this Friday morning. Thanks

## 2016-12-16 NOTE — Telephone Encounter (Signed)
Labs ordered and should be ready for pick up.  

## 2016-12-17 NOTE — Telephone Encounter (Signed)
Left detailed vm with information

## 2016-12-19 ENCOUNTER — Encounter: Payer: Managed Care, Other (non HMO) | Admitting: Family Medicine

## 2016-12-20 ENCOUNTER — Encounter: Payer: Managed Care, Other (non HMO) | Admitting: Family Medicine

## 2016-12-24 ENCOUNTER — Ambulatory Visit (INDEPENDENT_AMBULATORY_CARE_PROVIDER_SITE_OTHER): Payer: Managed Care, Other (non HMO) | Admitting: Family Medicine

## 2016-12-24 ENCOUNTER — Encounter: Payer: Self-pay | Admitting: Family Medicine

## 2016-12-24 VITALS — BP 96/63 | HR 67 | Temp 97.8°F | Ht 67.0 in | Wt 178.0 lb

## 2016-12-24 DIAGNOSIS — Z0000 Encounter for general adult medical examination without abnormal findings: Secondary | ICD-10-CM

## 2016-12-24 DIAGNOSIS — E039 Hypothyroidism, unspecified: Secondary | ICD-10-CM | POA: Diagnosis not present

## 2016-12-24 DIAGNOSIS — Z23 Encounter for immunization: Secondary | ICD-10-CM | POA: Diagnosis not present

## 2016-12-24 MED ORDER — BENZONATATE 200 MG PO CAPS: 200.0000 mg | ORAL_CAPSULE | Freq: Three times a day (TID) | ORAL | 12 refills | Status: DC | PRN

## 2016-12-24 NOTE — Progress Notes (Signed)
       Christy Burns is a 46 y.o. female who presents to [facility]: Primary Care Sports Medicine today for well adult visit.  Christy Burns is doing well with no active issues today. She feels well with no fevers chills nausea vomiting or diarrhea. She does not take any medications regularly. She notes that she is been forgetting to take her thyroid medicine for some time now. She exercises regularly and tries to eat a balanced diet.   Past Medical History:  Diagnosis Date  . Thyroid disease    Past Surgical History:  Procedure Laterality Date  . BREAST SURGERY    . ELBOW FRACTURE SURGERY     Social History  Substance Use Topics  . Smoking status: Never Smoker  . Smokeless tobacco: Never Used  . Alcohol use No   family history includes Depression in her mother; Diabetes in her father; Hearing loss in her maternal grandfather; Hyperlipidemia in her father; Hypertension in her father.  ROS as above:  Medications: Current Outpatient Prescriptions  Medication Sig Dispense Refill  . benzonatate (TESSALON) 200 MG capsule Take 1 capsule (200 mg total) by mouth 3 (three) times daily as needed for cough. 45 capsule 12  . fluticasone (FLONASE) 50 MCG/ACT nasal spray Place 2 sprays into both nostrils daily. 16 g 2  . loratadine (CLARITIN) 10 MG tablet Take 1 tablet (10 mg total) by mouth daily. 90 tablet 3  . levothyroxine (SYNTHROID, LEVOTHROID) 88 MCG tablet TAKE 1 TABLET BY MOUTH EVERY DAY (Patient not taking: Reported on 12/24/2016) 90 tablet 0   No current facility-administered medications for this visit.    No Known Allergies  Health Maintenance Health Maintenance  Topic Date Due  . PAP SMEAR  11/17/2017  . TETANUS/TDAP  01/30/2025  . INFLUENZA VACCINE  Completed  . HIV Screening  Completed     Exam:  BP 96/63   Pulse 67   Temp 97.8 F (36.6 C)   Ht  (1.702 m)   Wt 178 lb (80.7 kg)    SpO2 99%   BMI 27.88 kg/m  Gen: Well NAD HEENT: EOMI,  MMM Lungs: Normal work of breathing. CTABL Heart: RRR no MRG Abd: NABS, Soft. Nondistended, Nontender Exts: Brisk capillary refill, warm and well perfused.  Depression screen [facility] 2/9 12/24/2016  Decreased Interest 0  Down, Depressed, Hopeless 1  PHQ - 2 Score 1       No results found for this or any previous visit (from the past 72 hour(s)). No results found.    Assessment and Plan: 46 y.o. female with Well adult visit. Doing well. Plan to check basic fasting labs along with TSH in the near future. Flu vaccine given today. Plan to recheck sooner if needed.   Orders Placed This Encounter  Procedures  . Flu Vaccine QUAD 6+ mos PF IM (Fluarix Quad PF)  . CBC  . COMPLETE METABOLIC PANEL WITH GFR  . TSH  . Lipid Panel w/reflex Direct LDL      Discussed warning signs or symptoms. Please see discharge instructions. Patient expresses understanding.

## 2016-12-24 NOTE — Patient Instructions (Addendum)
Thank you for coming in today. Recheck yearly.  Get labs fasting in the near future.  Please take the thyroid medicine daily.  Try using a pill box.

## 2016-12-26 LAB — CBC
HEMATOCRIT: 37.9 % (ref 35.0–45.0)
HEMOGLOBIN: 12.9 g/dL (ref 11.7–15.5)
MCH: 29.9 pg (ref 27.0–33.0)
MCHC: 34 g/dL (ref 32.0–36.0)
MCV: 87.9 fL (ref 80.0–100.0)
MPV: 10.8 fL (ref 7.5–12.5)
Platelets: 200 10*3/uL (ref 140–400)
RBC: 4.31 10*6/uL (ref 3.80–5.10)
RDW: 12 % (ref 11.0–15.0)
WBC: 3.9 10*3/uL (ref 3.8–10.8)

## 2016-12-26 LAB — COMPLETE METABOLIC PANEL WITH GFR
AG RATIO: 1.9 (calc) (ref 1.0–2.5)
ALBUMIN MSPROF: 4.5 g/dL (ref 3.6–5.1)
ALKALINE PHOSPHATASE (APISO): 69 U/L (ref 33–115)
ALT: 20 U/L (ref 6–29)
AST: 13 U/L (ref 10–35)
BILIRUBIN TOTAL: 0.4 mg/dL (ref 0.2–1.2)
BUN: 16 mg/dL (ref 7–25)
CHLORIDE: 104 mmol/L (ref 98–110)
CO2: 27 mmol/L (ref 20–32)
Calcium: 9.5 mg/dL (ref 8.6–10.2)
Creat: 0.73 mg/dL (ref 0.50–1.10)
GFR, EST AFRICAN AMERICAN: 113 mL/min/{1.73_m2} (ref >=60)
GFR, Est Non African American: 97 mL/min/{1.73_m2} (ref >=60)
GLOBULIN: 2.4 g/dL (ref 1.9–3.7)
Glucose, Bld: 100 mg/dL — ABNORMAL HIGH (ref 65–99)
Potassium: 4.3 mmol/L (ref 3.5–5.3)
SODIUM: 139 mmol/L (ref 135–146)
TOTAL PROTEIN: 6.9 g/dL (ref 6.1–8.1)

## 2016-12-26 LAB — LIPID PANEL W/REFLEX DIRECT LDL
Cholesterol: 206 mg/dL — ABNORMAL HIGH (ref <200)
HDL: 71 mg/dL (ref >50)
LDL Cholesterol (Calc): 116 mg/dL (calc) — ABNORMAL HIGH
NON-HDL CHOLESTEROL (CALC): 135 mg/dL — AB (ref <130)
TRIGLYCERIDES: 87 mg/dL (ref <150)
Total CHOL/HDL Ratio: 2.9 (calc) (ref <5.0)

## 2016-12-26 LAB — TSH: TSH: 6.4 m[IU]/L — AB

## 2016-12-30 ENCOUNTER — Encounter: Payer: Self-pay | Admitting: Family Medicine

## 2017-02-17 ENCOUNTER — Encounter: Payer: Self-pay | Admitting: Family Medicine

## 2017-02-20 ENCOUNTER — Encounter: Payer: Self-pay | Admitting: Family Medicine

## 2017-02-20 ENCOUNTER — Telehealth: Payer: Self-pay | Admitting: Family Medicine

## 2017-02-20 ENCOUNTER — Ambulatory Visit: Payer: Managed Care, Other (non HMO) | Admitting: Family Medicine

## 2017-02-20 MED ORDER — PREDNISONE 10 MG PO TABS: 30.0000 mg | ORAL_TABLET | Freq: Every day | ORAL | 0 refills | Status: DC

## 2017-02-20 MED ORDER — AMOXICILLIN-POT CLAVULANATE 875-125 MG PO TABS: 1.0000 | ORAL_TABLET | Freq: Two times a day (BID) | ORAL | 0 refills | Status: AC

## 2017-02-20 NOTE — Telephone Encounter (Signed)
Pt called because she Is having right ear pain and she originally scheduled an appointment but called back and stated it was hard for her to leave work during the day. She stated that you all have talked at a previous visit and that you know her ear pain is chronic and you will typically just send in prednisone and Augmentin for her with out her being see. I told her we typically do see patients when they have an acute reason so we treat them appropriately but told her I would send you a note to see if there was something you could do. Thanks

## 2017-02-20 NOTE — Telephone Encounter (Signed)
I sent in pred[facility]one [facility] augmentin to [facility] [place]

## 2017-02-21 ENCOUNTER — Telehealth: Payer: Self-pay | Admitting: Family Medicine

## 2017-02-21 NOTE — Telephone Encounter (Signed)
Called patient to inform her that you sent in prescription. Patient did not answer and could not leave a message due to her mailbox being full. Thanks.

## 2017-04-22 ENCOUNTER — Encounter: Payer: Self-pay | Admitting: Family Medicine

## 2017-04-22 ENCOUNTER — Ambulatory Visit: Payer: Managed Care, Other (non HMO) | Admitting: Family Medicine

## 2017-04-22 VITALS — BP 113/76 | HR 82 | Ht 67.0 in | Wt 181.0 lb

## 2017-04-22 DIAGNOSIS — L600 Ingrowing nail: Secondary | ICD-10-CM

## 2017-04-22 MED ORDER — DOXYCYCLINE HYCLATE 100 MG PO TABS: 100.0000 mg | ORAL_TABLET | Freq: Two times a day (BID) | ORAL | 0 refills | Status: DC

## 2017-04-22 NOTE — Patient Instructions (Addendum)
Thank you for coming in today. You should hear from podiatry soon.  Let me know if you don't hear anything.   Take doxycycline antibiotic for 1 week.  Use warm water soaks.  Recheck if not better.   Ingrown Toenail An ingrown toenail occurs when the corner or sides of your toenail grow into the surrounding skin. The big toe is most commonly affected, but it can happen to any of your toes. If your ingrown toenail is not treated, you will be at risk for infection. What are the causes? This condition may be caused by:  Wearing shoes that are too small or tight.  Injury or trauma, such as stubbing your toe or having your toe stepped on.  Improper cutting or care of your toenails.  Being born with (congenital) nail or foot abnormalities, such as having a nail that is too big for your toe.  What increases the risk? Risk factors for an ingrown toenail include:  Age. Your nails tend to thicken as you get older, so ingrown nails are more common in older people.  Diabetes.  Cutting your toenails incorrectly.  Blood circulation problems.  What are the signs or symptoms? Symptoms may include:  Pain, soreness, or tenderness.  Redness.  Swelling.  Hardening of the skin surrounding the toe.  Your ingrown toenail may be infected if there is fluid, pus, or drainage. How is this diagnosed? An ingrown toenail may be diagnosed by medical history and physical exam. If your toenail is infected, your health care provider may test a sample of the drainage. How is this treated? Treatment depends on the severity of your ingrown toenail. Some ingrown toenails may be treated at home. More severe or infected ingrown toenails may require surgery to remove all or part of the nail. Infected ingrown toenails may also be treated with antibiotic medicines. Follow these instructions at home:  If you were prescribed an antibiotic medicine, finish all of it even if you start to feel better.  Soak your  foot in warm soapy water for 20 minutes, 3 times per day or as directed by your health care provider.  Carefully lift the edge of the nail away from the sore skin by wedging a small piece of cotton under the corner of the nail. This may help with the pain. Be careful not to cause more injury to the area.  Wear shoes that fit well. If your ingrown toenail is causing you pain, try wearing sandals, if possible.  Trim your toenails regularly and carefully. Do not cut them in a curved shape. Cut your toenails straight across. This prevents injury to the skin at the corners of the toenail.  Keep your feet clean and dry.  If you are having trouble walking and are given crutches by your health care provider, use them as directed.  Do not pick at your toenail or try to remove it yourself.  Take medicines only as directed by your health care provider.  Keep all follow-up visits as directed by your health care provider. This is important. Contact a health care provider if:  Your symptoms do not improve with treatment. Get help right away if:  You have red streaks that start at your foot and go up your leg.  You have a fever.  You have increased redness, swelling, or pain.  You have fluid, blood, or pus coming from your toenail. This information is not intended to replace advice given to you by your health care provider. Make sure  you discuss any questions you have with your health care provider. Document Released: 03/22/2000 Document Revised: 08/25/2015 Document Reviewed: 02/16/2014 Elsevier Interactive Patient Education  [employer]2018 [employer] Inc.

## 2017-04-22 NOTE — Progress Notes (Signed)
Christy Burns is a 47 y.o. female who presents to [facility]: [facility] Care [facility] Medicine today for left ingrown toenail.   Christy Burns notes years of mildly bothersome left great ingrown toenail. This became more painful and red recently. She suspect an infection. She notes that the overall bother of this issue is getting worse and she would like definitive treatment. She has seen Podiatry with Dr Raynald Kemp and did not like her very much. If she has a referral she would like a different podiatrist.    Past Medical History:  Diagnosis Date  . Thyroid disease    Past Surgical History:  Procedure Laterality Date  . BREAST SURGERY    . ELBOW FRACTURE SURGERY     Social History   Tobacco Use  . Smoking status: Never Smoker  . Smokeless tobacco: Never Used  Substance Use Topics  . Alcohol use: No    Alcohol/week: 0.0 oz   family history includes Depression in her mother; Diabetes in her father; Hearing loss in her maternal grandfather; Hyperlipidemia in her father; Hypertension in her father.  ROS as above:  Medications: Current Outpatient Medications  Medication Sig Dispense Refill  . fluticasone (FLONASE) 50 MCG/ACT nasal spray Place 2 sprays into both nostrils daily. 16 g 2  . levothyroxine (SYNTHROID, LEVOTHROID) 88 MCG tablet TAKE 1 TABLET BY MOUTH EVERY DAY 90 tablet 0  . loratadine (CLARITIN) 10 MG tablet Take 1 tablet (10 mg total) by mouth daily. 90 tablet 3  . doxycycline (VIBRA-TABS) 100 MG tablet Take 1 tablet (100 mg total) by mouth 2 (two) times daily. 14 tablet 0   No current facility-administered medications for this visit.    No Known Allergies  Health Maintenance Health Maintenance  Topic Date Due  . PAP SMEAR  11/17/2017  . TETANUS/TDAP  01/30/2025  . INFLUENZA VACCINE  Completed  . HIV Screening  Completed     Exam:  BP 113/76   Pulse 82   Ht 5\' 7"  (1.702 m)   Wt  181 lb (82.1 kg)   BMI 28.35 kg/m  Gen: Well NAD Left great toe: Slightly ingrown at the lateral border with erythremia and tenderness. No fluctuance or expressible pus. Sensation and capillary refill intact.  Pulses intact at the left foot.     No results found for this or any previous visit (from the past 72 hour(s)). No results found.    Assessment and Plan: 47 y.o. female with Infected ingrown toenail.  I had a long discussion about treatment options.  Plan to prescribe oral antibiotics for this issue and refer to podiatry for definitive treatment. A partial toenail removal is best done when the nail is not infected.  Rx doxycycline.    Orders Placed This Encounter  Procedures  . Ambulatory referral to Podiatry    Referral Priority:   Routine    Referral Type:   Consultation    Referral Reason:   Specialty Services Required    Requested Specialty:   Podiatry    Number of Visits Requested:   1   Meds ordered this encounter  Medications  . doxycycline (VIBRA-TABS) 100 MG tablet    Sig: Take 1 tablet (100 mg total) by mouth 2 (two) times daily.    Dispense:  14 tablet    Refill:  0     Discussed warning signs or symptoms. Please see discharge instructions. Patient expresses understanding.  I spent 25 minutes with this patient, greater than  50% was face-to-face time counseling regarding ddx and treatment plan.

## 2017-05-22 ENCOUNTER — Other Ambulatory Visit: Payer: Self-pay

## 2017-05-22 MED ORDER — LEVOTHYROXINE SODIUM 88 MCG PO TABS: 88.0000 ug | ORAL_TABLET | Freq: Every day | ORAL | 0 refills | Status: DC

## 2017-07-22 ENCOUNTER — Encounter: Payer: Self-pay | Admitting: Family Medicine

## 2017-07-22 MED ORDER — PREDNISONE 10 MG PO TABS: 30.0000 mg | ORAL_TABLET | Freq: Every day | ORAL | 0 refills | Status: DC

## 2017-07-22 MED ORDER — AMOXICILLIN-POT CLAVULANATE 875-125 MG PO TABS: 1.0000 | ORAL_TABLET | Freq: Two times a day (BID) | ORAL | 0 refills | Status: AC

## 2017-08-13 ENCOUNTER — Other Ambulatory Visit: Payer: Self-pay | Admitting: Family Medicine

## 2017-08-13 NOTE — Telephone Encounter (Signed)
[facility]. Last lab results indicates pt due to have thyroid levels re-checked. Pls advise if refill appropriate. Thanks.

## 2017-08-14 ENCOUNTER — Other Ambulatory Visit: Payer: Self-pay | Admitting: Family Medicine

## 2017-08-14 NOTE — Telephone Encounter (Signed)
Left a detailed vm msg for pt re: med [facility] to [facility]. Call back information provided.

## 2017-08-28 ENCOUNTER — Other Ambulatory Visit: Payer: Self-pay | Admitting: Family Medicine

## 2017-11-14 ENCOUNTER — Telehealth: Payer: Self-pay | Admitting: Family Medicine

## 2017-11-14 DIAGNOSIS — Z0000 Encounter for general adult medical examination without abnormal findings: Secondary | ICD-10-CM

## 2017-11-14 DIAGNOSIS — E039 Hypothyroidism, unspecified: Secondary | ICD-10-CM

## 2017-11-14 NOTE — Telephone Encounter (Signed)
Orders printed for Provider approval.

## 2017-11-14 NOTE — Telephone Encounter (Signed)
Pt called. She has scheduled a physical for 9/23 and would like to go to lab on 9/16  to have labs drawn.

## 2017-11-14 NOTE — Telephone Encounter (Signed)
Labs placed under PCP's name. Attempted to contact Pt, no answer and VM is full.

## 2017-11-14 NOTE — Telephone Encounter (Signed)
Thank you :)

## 2017-11-14 NOTE — Telephone Encounter (Signed)
I like the orders, CBC, CMP, lipid panel, TSH, would you go ahead and place those orders again but put them under Dr. Zollie Peeorey's name so that he gets the results?

## 2017-12-22 ENCOUNTER — Encounter: Payer: Self-pay | Admitting: Family Medicine

## 2017-12-24 LAB — COMPLETE METABOLIC PANEL WITH GFR
AG RATIO: 2 (calc) (ref 1.0–2.5)
ALT: 24 U/L (ref 6–29)
AST: 17 U/L (ref 10–35)
Albumin: 4.5 g/dL (ref 3.6–5.1)
Alkaline phosphatase (APISO): 61 U/L (ref 33–115)
BILIRUBIN TOTAL: 0.4 mg/dL (ref 0.2–1.2)
BUN: 15 mg/dL (ref 7–25)
CHLORIDE: 106 mmol/L (ref 98–110)
CO2: 24 mmol/L (ref 20–32)
Calcium: 9.7 mg/dL (ref 8.6–10.2)
Creat: 0.83 mg/dL (ref 0.50–1.10)
GFR, Est African American: 96 mL/min/{1.73_m2} (ref >=60)
GFR, Est Non African American: 83 mL/min/{1.73_m2} (ref >=60)
Globulin: 2.2 g/dL (calc) (ref 1.9–3.7)
Glucose, Bld: 100 mg/dL — ABNORMAL HIGH (ref 65–99)
POTASSIUM: 4.5 mmol/L (ref 3.5–5.3)
Sodium: 139 mmol/L (ref 135–146)
Total Protein: 6.7 g/dL (ref 6.1–8.1)

## 2017-12-24 LAB — LIPID PANEL
CHOLESTEROL: 204 mg/dL — AB (ref <200)
HDL: 72 mg/dL (ref >50)
LDL CHOLESTEROL (CALC): 115 mg/dL — AB
Non-HDL Cholesterol (Calc): 132 mg/dL (calc) — ABNORMAL HIGH (ref <130)
TRIGLYCERIDES: 78 mg/dL (ref <150)
Total CHOL/HDL Ratio: 2.8 (calc) (ref <5.0)

## 2017-12-24 LAB — CBC
HCT: 37.5 % (ref 35.0–45.0)
Hemoglobin: 12.8 g/dL (ref 11.7–15.5)
MCH: 29.6 pg (ref 27.0–33.0)
MCHC: 34.1 g/dL (ref 32.0–36.0)
MCV: 86.8 fL (ref 80.0–100.0)
MPV: 11.3 fL (ref 7.5–12.5)
Platelets: 191 10*3/uL (ref 140–400)
RBC: 4.32 10*6/uL (ref 3.80–5.10)
RDW: 12 % (ref 11.0–15.0)
WBC: 3.1 10*3/uL — AB (ref 3.8–10.8)

## 2017-12-24 LAB — TSH: TSH: 3.59 mIU/L

## 2017-12-29 ENCOUNTER — Ambulatory Visit (INDEPENDENT_AMBULATORY_CARE_PROVIDER_SITE_OTHER): Payer: Managed Care, Other (non HMO) | Admitting: Family Medicine

## 2017-12-29 ENCOUNTER — Encounter: Payer: Self-pay | Admitting: Family Medicine

## 2017-12-29 VITALS — BP 118/64 | HR 64 | Ht 67.0 in | Wt 179.0 lb

## 2017-12-29 DIAGNOSIS — E039 Hypothyroidism, unspecified: Secondary | ICD-10-CM

## 2017-12-29 DIAGNOSIS — Z780 Asymptomatic menopausal state: Secondary | ICD-10-CM | POA: Diagnosis not present

## 2017-12-29 DIAGNOSIS — Z23 Encounter for immunization: Secondary | ICD-10-CM

## 2017-12-29 DIAGNOSIS — Z0000 Encounter for general adult medical examination without abnormal findings: Secondary | ICD-10-CM

## 2017-12-29 DIAGNOSIS — Z1239 Encounter for other screening for malignant neoplasm of breast: Secondary | ICD-10-CM

## 2017-12-29 DIAGNOSIS — Z1231 Encounter for screening mammogram for malignant neoplasm of breast: Secondary | ICD-10-CM | POA: Diagnosis not present

## 2017-12-29 NOTE — Progress Notes (Signed)
Christy Burns is a 47 y.o. female who presents to [name]: Primary Care Sports Medicine today for well adult visit.   Meckenzie has done well over the last few months.  She is working very hard on a careful diet.  Her goal is about 1500 cal/day.  She is exercising regularly.  She feels well with no fevers chills nausea vomiting or diarrhea.  She takes medications listed below for hypothyroidism.  She denies any skin hair or nail changes.  She notes that she is due for breast cancer and bone density screening.  She is postmenopausal.  She is also due for cervical cancer screening and prefers to have an OB/GYN provider to do this.  She goes to [facility].  No chest pain palpitations or shortness of breath.   ROS as above:  Past Medical History:  Diagnosis Date  . Thyroid disease    Past Surgical History:  Procedure Laterality Date  . BREAST SURGERY    . ELBOW FRACTURE SURGERY     Social History   Tobacco Use  . Smoking status: Never Smoker  . Smokeless tobacco: Never Used  Substance Use Topics  . Alcohol use: No    Alcohol/week: 0.0 standard drinks   family history includes Depression in her mother; Diabetes in her father; Hearing loss in her maternal grandfather; Hyperlipidemia in her father; Hypertension in her father.  Medications: Current Outpatient Medications  Medication Sig Dispense Refill  . fluticasone (FLONASE) 50 MCG/ACT nasal spray Place 2 sprays into both nostrils daily. 16 g 2  . levothyroxine (SYNTHROID, LEVOTHROID) 88 MCG tablet TAKE 1 TABLET BY MOUTH EVERY DAY 90 tablet 1  . loratadine (CLARITIN) 10 MG tablet TAKE 1 TABLET (10 MG TOTAL) BY MOUTH DAILY. 90 tablet 0   No current facility-administered medications for this visit.    No Known Allergies  Health Maintenance Health Maintenance  Topic Date Due  . PAP SMEAR  11/17/2017  . TETANUS/TDAP  01/30/2025  .  INFLUENZA VACCINE  Completed  . HIV Screening  Completed     Exam:  BP 118/64   Pulse 64   Ht 5\' 7"  (1.702 m)   Wt 179 lb (81.2 kg)   BMI 28.04 kg/m  Wt Readings from Last 5 Encounters:  12/29/17 179 lb (81.2 kg)  04/22/17 181 lb (82.1 kg)  12/24/16 178 lb (80.7 kg)  10/10/16 180 lb (81.6 kg)  05/16/16 173 lb (78.5 kg)      Gen: Well NAD HEENT: EOMI,  MMM Lungs: Normal work of breathing. CTABL Heart: RRR no MRG Abd: NABS, Soft. Nondistended, Nontender Exts: Brisk capillary refill, warm and well perfused.  Psych: Alert and oriented normal speech thought process and affect.  Depression screen [facility] 2/9 12/29/2017 12/24/2016  Decreased Interest 0 0  Down, Depressed, Hopeless 0 1  PHQ - 2 Score 0 1       Lab and Radiology Results Recent Results (from the past 2160 hour(s))  CBC     Status: Abnormal   Collection Time: 12/24/17  7:46 AM  Result Value Ref Range   WBC 3.1 (L) 3.8 - 10.8 Thousand/uL   RBC 4.32 3.80 - 5.10 Million/uL   Hemoglobin 12.8 11.7 - 15.5 g/dL   HCT 16.1 09.6 - 04.5 %   MCV 86.8 80.0 - 100.0 fL   MCH 29.6 27.0 - 33.0 pg   MCHC 34.1 32.0 - 36.0 g/dL   RDW 40.9 81.1 - 91.4 %  Platelets 191 140 - 400 Thousand/uL   MPV 11.3 7.5 - 12.5 fL  COMPLETE METABOLIC PANEL WITH GFR     Status: Abnormal   Collection Time: 12/24/17  7:46 AM  Result Value Ref Range   Glucose, Bld 100 (H) 65 - 99 mg/dL    Comment: .            Fasting reference interval . For someone without known diabetes, a glucose value between 100 and 125 mg/dL is consistent with prediabetes and should be confirmed with a follow-up test. .    BUN 15 7 - 25 mg/dL   Creat 1.610.83 0.960.50 - 0.451.10 mg/dL   GFR, Est Non African American 83 > OR = 60 mL/min/1.8273m2   GFR, Est African American 96 > OR = 60 mL/min/1.7473m2   BUN/Creatinine Ratio NOT APPLICABLE 6 - 22 (calc)   Sodium 139 135 - 146 mmol/L   Potassium 4.5 3.5 - 5.3 mmol/L   Chloride 106 98 - 110 mmol/L   CO2 24 20 - 32 mmol/L    Calcium 9.7 8.6 - 10.2 mg/dL   Total Protein 6.7 6.1 - 8.1 g/dL   Albumin 4.5 3.6 - 5.1 g/dL   Globulin 2.2 1.9 - 3.7 g/dL (calc)   AG Ratio 2.0 1.0 - 2.5 (calc)   Total Bilirubin 0.4 0.2 - 1.2 mg/dL   Alkaline phosphatase (APISO) 61 33 - 115 U/L   AST 17 10 - 35 U/L   ALT 24 6 - 29 U/L  Lipid Profile     Status: Abnormal   Collection Time: 12/24/17  7:46 AM  Result Value Ref Range   Cholesterol 204 (H) <200 mg/dL   HDL 72 >40>50 mg/dL   Triglycerides 78 <981<150 mg/dL   LDL Cholesterol (Calc) 115 (H) mg/dL (calc)    Comment: Reference range: <100 . Desirable range <100 mg/dL for primary prevention;   <70 mg/dL for patients with CHD or diabetic patients  with > or = 2 CHD risk factors. Marland Kitchen. LDL-C is now calculated using the Martin-Hopkins  calculation, which is a validated novel method providing  better accuracy than the Friedewald equation in the  estimation of LDL-C.  Horald PollenMartin SS et al. Lenox AhrJAMA. 1914;782(952013;310(19): 2061-2068  (http://education.[facility].com/faq/FAQ164)    Total CHOL/HDL Ratio 2.8 <5.0 (calc)   Non-HDL Cholesterol (Calc) 132 (H) <130 mg/dL (calc)    Comment: For patients with diabetes plus 1 major ASCVD risk  factor, treating to a non-HDL-C goal of <100 mg/dL  (LDL-C of <62<70 mg/dL) is considered a therapeutic  option.   TSH     Status: None   Collection Time: 12/24/17  7:46 AM  Result Value Ref Range   TSH 3.59 mIU/L    Comment:           Reference Range .           > or = 20 Years  0.40-4.50 .                Pregnancy Ranges           First trimester    0.26-2.66           Second trimester   0.55-2.73           Third trimester    0.43-2.91        Assessment and Plan: 47 y.o. female with  Well adult visit.  Doing reasonably well.  Plan to continue current medications. Continue lower calorie diet and exercise. Order mammogram and DEXA  scan for screening breast cancer and bone density.  Recommend follow-up with OB/GYN for cervical cancer screening as patient  prefers a female provider. Recheck yearly if all is well.  Flu vaccine given today prior to discharge. Anticipate colon cancer screening at 47 years old next year.  Orders Placed This Encounter  Procedures  . MM 3D SCREEN BREAST BILATERAL    Standing Status:   Future    Standing Expiration Date:   03/01/2019    Order Specific Question:   Reason for Exam (SYMPTOM  OR DIAGNOSIS REQUIRED)    Answer:   screen breast cancer    Order Specific Question:   Is the patient pregnant?    Answer:   No    Order Specific Question:   Preferred imaging location?    Answer:   [facility]  . DG Bone Density    Standing Status:   Future    Standing Expiration Date:   03/01/2019    Order Specific Question:   Reason for exam:    Answer:   eval bone density    Order Specific Question:   Preferred imaging location?    Answer:   [facility]  . Flu Vaccine QUAD 36+ mos IM   No orders of the defined types were placed in this encounter.    Discussed warning signs or symptoms. Please see discharge instructions. Patient expresses understanding.

## 2017-12-29 NOTE — Patient Instructions (Signed)
Thank you for coming in today. Continue current treatment.  Follow up with OBGYN for pap smear.  Let me know who it is.  Go to xray and get mammogram and bone density test.  Recheck with me yearly or sooner if needed.   Colon cancer screening at 47 years old.

## 2017-12-30 ENCOUNTER — Encounter: Payer: Self-pay | Admitting: Family Medicine

## 2018-01-04 ENCOUNTER — Encounter: Payer: Self-pay | Admitting: Family Medicine

## 2018-01-05 MED ORDER — CEFDINIR 300 MG PO CAPS: 300.0000 mg | ORAL_CAPSULE | Freq: Two times a day (BID) | ORAL | 0 refills | Status: DC

## 2018-01-05 MED ORDER — PREDNISONE 10 MG PO TABS: 30.0000 mg | ORAL_TABLET | Freq: Every day | ORAL | 0 refills | Status: DC

## 2018-01-13 MED ORDER — PREDNISONE 10 MG PO TABS: 30.0000 mg | ORAL_TABLET | Freq: Every day | ORAL | 0 refills | Status: DC

## 2018-01-13 NOTE — Addendum Note (Signed)
Addended by: Rodolph Bong on: 01/13/2018 06:54 AM   Modules accepted: Orders

## 2018-01-14 ENCOUNTER — Ambulatory Visit: Payer: Managed Care, Other (non HMO)

## 2018-01-14 ENCOUNTER — Other Ambulatory Visit: Payer: Managed Care, Other (non HMO)

## 2018-01-28 ENCOUNTER — Other Ambulatory Visit: Payer: Managed Care, Other (non HMO)

## 2018-02-04 ENCOUNTER — Ambulatory Visit (INDEPENDENT_AMBULATORY_CARE_PROVIDER_SITE_OTHER): Payer: Managed Care, Other (non HMO)

## 2018-02-04 DIAGNOSIS — Z780 Asymptomatic menopausal state: Secondary | ICD-10-CM

## 2018-02-04 DIAGNOSIS — M8589 Other specified disorders of bone density and structure, multiple sites: Secondary | ICD-10-CM | POA: Diagnosis not present

## 2018-02-05 ENCOUNTER — Encounter: Payer: Self-pay | Admitting: Family Medicine

## 2018-02-05 DIAGNOSIS — E559 Vitamin D deficiency, unspecified: Secondary | ICD-10-CM | POA: Insufficient documentation

## 2018-02-05 DIAGNOSIS — M8580 Other specified disorders of bone density and structure, unspecified site: Secondary | ICD-10-CM

## 2018-02-05 HISTORY — DX: Other specified disorders of bone density and structure, unspecified site: M85.80

## 2018-02-18 ENCOUNTER — Encounter: Payer: Self-pay | Admitting: Family Medicine

## 2018-03-12 ENCOUNTER — Ambulatory Visit (INDEPENDENT_AMBULATORY_CARE_PROVIDER_SITE_OTHER): Payer: Managed Care, Other (non HMO) | Admitting: Family Medicine

## 2018-03-12 ENCOUNTER — Encounter: Payer: Self-pay | Admitting: Family Medicine

## 2018-03-12 VITALS — BP 128/75 | HR 85 | Ht 65.0 in | Wt 186.0 lb

## 2018-03-12 DIAGNOSIS — E6609 Other obesity due to excess calories: Secondary | ICD-10-CM

## 2018-03-12 DIAGNOSIS — M85851 Other specified disorders of bone density and structure, right thigh: Secondary | ICD-10-CM | POA: Diagnosis not present

## 2018-03-12 DIAGNOSIS — R29898 Other symptoms and signs involving the musculoskeletal system: Secondary | ICD-10-CM

## 2018-03-12 DIAGNOSIS — E559 Vitamin D deficiency, unspecified: Secondary | ICD-10-CM

## 2018-03-12 DIAGNOSIS — R739 Hyperglycemia, unspecified: Secondary | ICD-10-CM

## 2018-03-12 DIAGNOSIS — Z6830 Body mass index (BMI) 30.0-30.9, adult: Secondary | ICD-10-CM

## 2018-03-12 DIAGNOSIS — M85852 Other specified disorders of bone density and structure, left thigh: Secondary | ICD-10-CM

## 2018-03-12 MED ORDER — LEVOTHYROXINE SODIUM 88 MCG PO TABS: 88.0000 ug | ORAL_TABLET | Freq: Every day | ORAL | 1 refills | Status: DC

## 2018-03-12 MED ORDER — METFORMIN HCL ER 500 MG PO TB24: 500.0000 mg | ORAL_TABLET | Freq: Every day | ORAL | 1 refills | Status: DC

## 2018-03-12 NOTE — Progress Notes (Signed)
Christy Burns is a 47 y.o. female who [facility] [facility] today for follow-up obesity, and osteopenia.  Christy BradfordKimberly had a bone density test a few months ago that showed osteopenia with a T score of -1.9.  She does have a pertinent history for vitamin D deficiency.  She is not currently taking any supplementation.  She does get regular weightbearing exercise however.  She takes a regular women's multivitamin.  Additionally she notes that she has been struggling to lose weight.  She has been eating a calorie restricted diet.  She does get exercise as above.  When asked she notes that she is not taking her levothyroxine regularly.  She denies any skin hair or nail changes but notes that she has gained weight.   Additionally she notes some mild left elbow clicking.  This occurs without much pain.  Is been ongoing for a few weeks without injury.  She notes as a child she suffered an elbow fracture.  She is able to continue exercise and weight lifting.  Patient denies any locking or catching.   ROS as above:  Exam:  BP 128/75   Pulse 85   Ht 5\' 5"  (1.651 m)   Wt 186 lb (84.4 kg)   BMI 30.95 kg/m  Wt Readings from Last 5 Encounters:  03/12/18 186 lb (84.4 kg)  12/29/17 179 lb (81.2 kg)  04/22/17 181 lb (82.1 kg)  12/24/16 178 lb (80.7 kg)  10/10/16 180 lb (81.6 kg)    Gen: Well NAD HEENT: EOMI,  MMM no goiter Lungs: Normal work of breathing. CTABL Heart: RRR no MRG Abd: NABS, Soft. Nondistended, Nontender Exts: Brisk capillary refill, warm and well perfused.  Left elbow normal-appearing normal motion.  Palpable click with pronation supination felt to radial head.  Intact strength.  Lab and Radiology Results Lab Results  Component Value Date   TSH 3.59 12/24/2017     Chemistry      Component Value Date/Time   NA 139 12/24/2017 0746   K 4.5 12/24/2017 0746   CL 106 12/24/2017 0746   CO2 24 12/24/2017 0746   BUN 15 12/24/2017 0746   CREATININE 0.83 12/24/2017 0746      Component Value Date/Time   CALCIUM 9.7 12/24/2017 0746   ALKPHOS 55 12/22/2015 0808   AST 17 12/24/2017 0746   ALT 24 12/24/2017 0746   BILITOT 0.4 12/24/2017 0746      Dg Bone Density  Result Date: 02/04/2018 EXAM: DUAL X-RAY ABSORPTIOMETRY (DXA) FOR BONE MINERAL DENSITY IMPRESSION: Christy Burns Your patient Christy Burns completed a BMD test on 02/04/2018 using the Lunar IDXA DXA System (analysis version: 16.SP2) manufactured by [employer]. The following summarizes the results of our evaluation. PATIENT: Name: Christy Burns, Christy Burns Patient ID: [id number] Birth Date: 1970-07-12 Height: 65.5 in. Gender: Female Measured: 02/04/2018 Weight: 182.0 lbs. Indications: Caucasian, Early Menopause, Estrogen Deficiency, Low Calcium Intake, Postmenopausal Fractures: Elbow, Wrist Treatments: ASSESSMENT: The BMD measured at Femur Neck Left is 0.775 g/cm2 with a T-score of -1.9. This patient is considered osteopenic according to World Health Organization [facility](WHO) criteria. The scan quality is good. Site Region Measured Date Measured Age WHO YA BMD Classification T-score AP Spine L1-L4 02/04/2018 [age] Low Bone Mass -1.7 0.979 g/cm2 DualFemur Neck Left 1/04/2018 [age] Low Bone Mass -1.9 0.775 g/cm2 World Health Organization [facility](WHO) criteria for post-menopausal, Caucasian Women: Normal        T-score at or above -1  SD Low Bone Mass T-score between -1 and -2.5 SD Osteoporosis  T-score at or below -2.5 SD RECOMMENDATION: 1. All patients should optimize calcium and vitamin D intake. 2. Consider FDA-approved medical therapies in postmenopausal women and men age 81 years and older, based on the following: a. A hip or vertebral(clinical or morphometric) fracture b. T-score < -2.5 at the femoral neck or spine after appropriate evaluation to exclude secondary causes c. Low bone mass (T-score between -1.0 and -2.5 at  the femoral neck or spine) and a 10-year probability of a hip fracture > 3% or a 10-year probability of a major osteoporosis-related fracture > 20% based on the [place]-adapted WHO algorithm d. Clinician judgement and/or patient preferences may indicate treatment for people with 10-year fracture probabilities above or below these levels FOLLOW-UP: Patients with diagnosis of osteoporosis or at high risk for fracture should have regular bone mineral density tests. For patients eligible for Medicare, routine testing is allowed once every 2 years. The testing frequency can be increased to one year for patients who have rapidly progressing disease, those who are receiving or discontinuing medical therapy to restore bone mass, or have additional risk factors. I have reviewed this report, and agree with the above findings [facility] Patient: Christy Burns   Referring Physician: Rodolph Burns Birth Date: 1970-04-14 [age] years Patient ID: [id number] Height: 65.5 in. Weight: 182.0 lbs. Measured: 02/04/2018 4:21:19 PM (16 SP 2) Gender: Female Ethnicity: White Analyzed: 02/04/2018 4:24:13 PM (16 SP 2) FRAX* 10-year Probability of Fracture Based on femoral neck BMD: DualFemur (Left) Major Osteoporotic Fracture: 4.9% Hip Fracture:                0.6% Population:                  [place] (Caucasian) Risk Factors:                None *FRAX is a Armed forces logistics/support/administrative officer of the [employer] for Metabolic Bone Disease, a World Science writer (WHO) [employer]. ASSESSMENT: The probability of a major osteoporotic fracture is 4.9% within the next ten years. The probability of a hip fracture is 0.6% within the next ten years. Electronically Signed   By: Christy Salts M.D.   On: 02/04/2018 16:38  .   Assessment and Plan: 47 y.o. female with  Osteopenia with T score -1.9.  Nonadherence to levothyroxine may be a factor here.  Additionally vitamin D deficiency may be to start vitamin D  supplement restart levothyroxine usage and continue regular weightbearing.  Will recheck in about 2 years if still low or worsening consider bisphosphonates or Prolia.  Obesity: Bothersome problem for patient.  Discussed options.  Plan to continue restricted calorie diet and regular exercise.  Nonadherence to levothyroxine may be a factor as well.  Plan to restart levothyroxine.  Given history of mild hyperglycemia with close of around 100 reasonable to use a trial of low-dose extended release Metformin as this may help as well.  Patient's insurance does not cover branded weight loss medications.  Backup plan may be trial of topiramate or bupropion.  Recheck in 2 months  Elbow clicking: Likely loose body from history of fracture as a child.  Patient denies any locking or catching.  Symptoms are mild.  Watchful waiting recheck in 2 months.  I spent 25 minutes with this patient, greater than 50% was face-to-face time counseling regarding differential diagnosis and plan.Marland Kitchen  No orders of the defined types were placed in this encounter.  Meds ordered this encounter  Medications  . metFORMIN (GLUCOPHAGE-XR) 500 MG 24 hr tablet    Sig: Take 1 tablet (500 mg total) by mouth daily with breakfast.    Dispense:  90 tablet    Refill:  1  . levothyroxine (SYNTHROID, LEVOTHROID) 88 MCG tablet    Sig: Take 1 tablet (88 mcg total) by mouth daily.    Dispense:  90 tablet    Refill:  1     Historical information moved to improve visibility of documentation.  Past Medical History:  Diagnosis Date  . Osteopenia 02/05/2018   ASSESSMENT: The BMD measured at Femur Neck Left is 0.775 g/cm2 with a T-score of -1.9. This patient is considered osteopenic according to World Health Organization [facility]) criteria. The scan quality is good.  02/05/18   . Thyroid disease    Past Surgical History:  Procedure Laterality Date  . BREAST SURGERY    . ELBOW FRACTURE SURGERY     Social History   Tobacco Use  . Smoking  status: Never Smoker  . Smokeless tobacco: Never Used  Substance Use Topics  . Alcohol use: No    Alcohol/week: 0.0 standard drinks   family history includes Depression in her mother; Diabetes in her father; Hearing loss in her maternal grandfather; Hyperlipidemia in her father; Hypertension in her father.  Medications: Current Outpatient Medications  Medication Sig Dispense Refill  . fluticasone (FLONASE) 50 MCG/ACT nasal spray Place 2 sprays into both nostrils daily. 16 g 2  . levothyroxine (SYNTHROID, LEVOTHROID) 88 MCG tablet Take 1 tablet (88 mcg total) by mouth daily. 90 tablet 1  . loratadine (CLARITIN) 10 MG tablet TAKE 1 TABLET (10 MG TOTAL) BY MOUTH DAILY. 90 tablet 0  . metFORMIN (GLUCOPHAGE-XR) 500 MG 24 hr tablet Take 1 tablet (500 mg total) by mouth daily with breakfast. 90 tablet 1   No current facility-administered medications for this visit.    No Known Allergies   Discussed warning signs or symptoms. Please see discharge instructions. Patient expresses understanding.

## 2018-03-12 NOTE — Patient Instructions (Addendum)
Thank you for coming in today. Remember to take your levothyroxine.  Use a daily pill box and also consider leaving some at work.  Start metformin for weight and sugar.  Take vit D 5000 units over the counter daily.   Recheck in 2 months.  Return sooner if needed.   Continue restricted diet.  Continue walking and exercise.

## 2018-04-28 ENCOUNTER — Encounter: Payer: Self-pay | Admitting: Family Medicine

## 2018-05-01 ENCOUNTER — Encounter: Payer: Self-pay | Admitting: Family Medicine

## 2018-05-13 ENCOUNTER — Ambulatory Visit: Payer: Managed Care, Other (non HMO) | Admitting: Family Medicine

## 2018-05-27 ENCOUNTER — Encounter: Payer: Self-pay | Admitting: Family Medicine

## 2018-05-27 MED ORDER — CEFDINIR 300 MG PO CAPS: 300.0000 mg | ORAL_CAPSULE | Freq: Two times a day (BID) | ORAL | 0 refills | Status: DC

## 2018-05-27 MED ORDER — PREDNISONE 10 MG PO TABS: 30.0000 mg | ORAL_TABLET | Freq: Every day | ORAL | 0 refills | Status: DC

## 2018-06-01 MED ORDER — CEFDINIR 300 MG PO CAPS: 300.0000 mg | ORAL_CAPSULE | Freq: Two times a day (BID) | ORAL | 0 refills | Status: DC

## 2018-06-01 MED ORDER — PREDNISONE 10 MG PO TABS: 30.0000 mg | ORAL_TABLET | Freq: Every day | ORAL | 0 refills | Status: DC

## 2018-06-01 NOTE — Addendum Note (Signed)
Addended by: Rodolph Bong on: 06/01/2018 12:14 PM   Modules accepted: Orders

## 2018-06-18 ENCOUNTER — Other Ambulatory Visit: Payer: Self-pay

## 2018-06-18 ENCOUNTER — Encounter: Payer: Self-pay | Admitting: Family Medicine

## 2018-06-18 ENCOUNTER — Ambulatory Visit (INDEPENDENT_AMBULATORY_CARE_PROVIDER_SITE_OTHER): Payer: Managed Care, Other (non HMO) | Admitting: Family Medicine

## 2018-06-18 VITALS — BP 110/74 | HR 98 | Temp 98.4°F | Resp 12 | Wt 171.0 lb

## 2018-06-18 DIAGNOSIS — R05 Cough: Secondary | ICD-10-CM | POA: Diagnosis not present

## 2018-06-18 DIAGNOSIS — R6889 Other general symptoms and signs: Secondary | ICD-10-CM

## 2018-06-18 DIAGNOSIS — R059 Cough, unspecified: Secondary | ICD-10-CM

## 2018-06-18 LAB — POCT INFLUENZA A/B
Influenza A, POC: NEGATIVE
Influenza B, POC: NEGATIVE

## 2018-06-18 MED ORDER — OSELTAMIVIR PHOSPHATE 75 MG PO CAPS: 75.0000 mg | ORAL_CAPSULE | Freq: Two times a day (BID) | ORAL | 0 refills | Status: DC

## 2018-06-18 MED ORDER — GUAIFENESIN-CODEINE 100-10 MG/5ML PO SOLN: 5.0000 mL | Freq: Four times a day (QID) | ORAL | 0 refills | Status: DC | PRN

## 2018-06-18 MED ORDER — BENZONATATE 200 MG PO CAPS: 200.0000 mg | ORAL_CAPSULE | Freq: Three times a day (TID) | ORAL | 0 refills | Status: DC | PRN

## 2018-06-18 NOTE — Progress Notes (Signed)
Christy Burns is a 48 y.o. female who presents to [facility]: Primary Care Sports Medicine today for fevers chills body aches and cough.   Christy Burns and her husband Christy Burns and their child recently travel to [place] [place] [facility] for a [employer] trip.  They are [place] from March 1 to March 7.  They traveled through [employer] airport.  She became ill starting on March 10.  She developed body aches fevers chills headache and nonproductive cough.  Her husband is sick with similar illness.  Denies significant shortness of breath.  She notes her cough is quite bothersome and interfering with sleep.  She is tried several over-the-counter cough medications which helped only a little.  Christy Burns's husband tested positive for flu today.  ROS as above:  Exam:  BP 110/74   Pulse 98   Temp 98.4 F (36.9 C) (Oral)   Resp 12   Wt 171 lb (77.6 kg)   SpO2 97%   BMI 28.46 kg/m  Wt Readings from Last 5 Encounters:  06/18/18 171 lb (77.6 kg)  03/12/18 186 lb (84.4 kg)  12/29/17 179 lb (81.2 kg)  04/22/17 181 lb (82.1 kg)  12/24/16 178 lb (80.7 kg)    Gen: Well NAD HEENT: EOMI,  MMM clear nasal discharge. Lungs: Normal work of breathing. CTABL Heart: RRR no MRG Abd: NABS, Soft. Nondistended, Nontender Exts: Brisk capillary refill, warm and well perfused.   Lab and Radiology Results Results for orders placed or performed in visit on 06/18/18 (from the past 72 hour(s))  POCT Influenza A/B     Status: Normal   Collection Time: 06/18/18 11:20 AM  Result Value Ref Range   Influenza A, POC Negative Negative   Influenza B, POC Negative Negative   No results found.    Assessment and Plan: 48 y.o. female with lower respiratory tract infection.  Her husband tested positive for flu a today.  I think it is likely that Christy Burns also has the flu and she has a false negative test.  She does not have a lot of nasal  discharge and that could explain a potential false negative test.  Plan for empiric treatment with Tamiflu.  Will treat cough with over-the-counter cough medications.  Additionally use Tessalon Perles.  Will use codeine-containing cough medication at bedtime as needed.  Additionally discussed possibility of COVID-19.  Given positive flu test and her family I think her symptoms are more likely due to influenza than other explanation.  However we will keep a broad differential and proceed with further testing in the future if needed.  Recommended self-isolation until feeling better.  PDMP not reviewed this encounter. Orders Placed This Encounter  Procedures  . POCT Influenza A/B   Meds ordered this encounter  Medications  . benzonatate (TESSALON) 200 MG capsule    Sig: Take 1 capsule (200 mg total) by mouth 3 (three) times daily as needed for cough.    Dispense:  45 capsule    Refill:  0  . oseltamivir (TAMIFLU) 75 MG capsule    Sig: Take 1 capsule (75 mg total) by mouth 2 (two) times daily.    Dispense:  10 capsule    Refill:  0  . guaiFENesin-codeine 100-10 MG/5ML syrup    Sig: Take 5 mLs by mouth every 6 (six) hours as needed for cough. Take mostly at bedtime    Dispense:  120 mL    Refill:  0     Historical  information moved to improve visibility of documentation.  Past Medical History:  Diagnosis Date  . Osteopenia 02/05/2018   ASSESSMENT: The BMD measured at Femur Neck Left is 0.775 g/cm2 with a T-score of -1.9. This patient is considered osteopenic according to World Health Organization [facility]) criteria. The scan quality is good.  02/05/18   . Thyroid disease    Past Surgical History:  Procedure Laterality Date  . BREAST SURGERY    . ELBOW FRACTURE SURGERY     Social History   Tobacco Use  . Smoking status: Never Smoker  . Smokeless tobacco: Never Used  Substance Use Topics  . Alcohol use: No    Alcohol/week: 0.0 standard drinks   family history includes Depression  in her mother; Diabetes in her father; Hearing loss in her maternal grandfather; Hyperlipidemia in her father; Hypertension in her father.  Medications: Current Outpatient Medications  Medication Sig Dispense Refill  . cefdinir (OMNICEF) 300 MG capsule Take 1 capsule (300 mg total) by mouth 2 (two) times daily. 14 capsule 0  . fluticasone (FLONASE) 50 MCG/ACT nasal spray Place 2 sprays into both nostrils daily. 16 g 2  . levothyroxine (SYNTHROID, LEVOTHROID) 88 MCG tablet Take 1 tablet (88 mcg total) by mouth daily. 90 tablet 1  . loratadine (CLARITIN) 10 MG tablet TAKE 1 TABLET (10 MG TOTAL) BY MOUTH DAILY. 90 tablet 0  . metFORMIN (GLUCOPHAGE-XR) 500 MG 24 hr tablet Take 1 tablet (500 mg total) by mouth daily with breakfast. 90 tablet 1  . predniSONE (DELTASONE) 10 MG tablet Take 3 tablets (30 mg total) by mouth daily with breakfast. 15 tablet 0  . benzonatate (TESSALON) 200 MG capsule Take 1 capsule (200 mg total) by mouth 3 (three) times daily as needed for cough. 45 capsule 0  . guaiFENesin-codeine 100-10 MG/5ML syrup Take 5 mLs by mouth every 6 (six) hours as needed for cough. Take mostly at bedtime 120 mL 0  . oseltamivir (TAMIFLU) 75 MG capsule Take 1 capsule (75 mg total) by mouth 2 (two) times daily. 10 capsule 0   No current facility-administered medications for this visit.    No Known Allergies   Discussed warning signs or symptoms. Please see discharge instructions. Patient expresses understanding.

## 2018-06-18 NOTE — Patient Instructions (Addendum)
Thank you for coming in today. Take over-the-counter medications.  Use Tylenol and ibuprofen as needed for pain and fevers and body aches. Use Mucinex DM extended release twice daily. Use prescription Tessalon Perles as needed.  Use codeine-containing cough syrup as needed.  Recheck as needed.  Call or go to the emergency room if you get worse, have trouble breathing, have chest pains, or palpitations.    Viral Respiratory Infection A respiratory infection is an illness that affects part of the respiratory system, such as the lungs, nose, or throat. A respiratory infection that is caused by a virus is called a viral respiratory infection. Common types of viral respiratory infections include:  A cold.  The flu (influenza).  A respiratory syncytial virus (RSV) infection. What are the causes? This condition is caused by a virus. What are the signs or symptoms? Symptoms of this condition include:  A stuffy or runny nose.  Yellow or green nasal discharge.  A cough.  Sneezing.  Fatigue.  Achy muscles.  A sore throat.  Sweating or chills.  A fever.  A headache. How is this diagnosed? This condition may be diagnosed based on:  Your symptoms.  A physical exam.  Testing of nasal swabs. How is this treated? This condition may be treated with medicines, such as:  Antiviral medicine. This may shorten the length of time a person has symptoms.  Expectorants. These make it easier to cough up mucus.  Decongestant nasal sprays.  Acetaminophen or NSAIDs to relieve fever and pain. Antibiotic medicines are not prescribed for viral infections. This is because antibiotics are designed to kill bacteria. They are not effective against viruses. Follow these instructions at home:  Managing pain and congestion  Take over-the-counter and prescription medicines only as told by your health care provider.  If you have a sore throat, gargle with a salt-water mixture 3-4 times a  day or as needed. To make a salt-water mixture, completely dissolve -1 tsp of salt in 1 cup of warm water.  Use nose drops made from salt water to ease congestion and soften raw skin around your nose.  Drink enough fluid to keep your urine pale yellow. This helps prevent dehydration and helps loosen up mucus. General instructions  Rest as much as possible.  Do not drink alcohol.  Do not use any products that contain nicotine or tobacco, such as cigarettes and e-cigarettes. If you need help quitting, ask your health care provider.  Keep all follow-up visits as told by your health care provider. This is important. How is this prevented?   Get an annual flu shot. You may get the flu shot in late summer, fall, or winter. Ask your health care provider when you should get your flu shot.  Avoid exposing others to your respiratory infection. ? Stay home from work or school as told by your health care provider. ? Wash your hands with soap and water often, especially after you cough or sneeze. If soap and water are not available, use alcohol-based hand sanitizer.  Avoid contact with people who are sick during cold and flu season. This is generally fall and winter. Contact a health care provider if:  Your symptoms last for 10 days or longer.  Your symptoms get worse over time.  You have a fever.  You have severe sinus pain in your face or forehead.  The glands in your jaw or neck become very swollen. Get help right away if you:  Feel pain or pressure in your chest.  Have shortness of breath.  Faint or feel like you will faint.  Have severe and persistent vomiting.  Feel confused or disoriented. Summary  A respiratory infection is an illness that affects part of the respiratory system, such as the lungs, nose, or throat. A respiratory infection that is caused by a virus is called a viral respiratory infection.  Common types of viral respiratory infections are a cold, influenza,  and respiratory syncytial virus (RSV) infection.  Symptoms of this condition include a stuffy or runny nose, cough, sneezing, fatigue, achy muscles, sore throat, and fevers or chills.  Antibiotic medicines are not prescribed for viral infections. This is because antibiotics are designed to kill bacteria. They are not effective against viruses. This information is not intended to replace advice given to you by your health care provider. Make sure you discuss any questions you have with your health care provider. Document Released: 01/02/2005 Document Revised: 05/05/2017 Document Reviewed: 05/05/2017 Elsevier Interactive Patient Education  2019 [employer].

## 2018-06-24 ENCOUNTER — Encounter: Payer: Self-pay | Admitting: Family Medicine

## 2018-08-05 ENCOUNTER — Encounter: Payer: Self-pay | Admitting: Family Medicine

## 2018-08-05 NOTE — Progress Notes (Unsigned)
Patient due for Pap smear.  Sent records requested [facility]

## 2018-08-06 ENCOUNTER — Encounter: Payer: Self-pay | Admitting: Family Medicine

## 2018-08-06 ENCOUNTER — Telehealth: Payer: Self-pay | Admitting: Family Medicine

## 2018-08-06 NOTE — Telephone Encounter (Signed)
Misty Stanley with [facility] lvm to let us know that Ms. Calaway is not a patient at their office.  Thanks

## 2019-01-05 ENCOUNTER — Telehealth: Payer: Self-pay | Admitting: Family Medicine

## 2019-01-05 DIAGNOSIS — E559 Vitamin D deficiency, unspecified: Secondary | ICD-10-CM

## 2019-01-05 DIAGNOSIS — M85851 Other specified disorders of bone density and structure, right thigh: Secondary | ICD-10-CM

## 2019-01-05 DIAGNOSIS — Z0000 Encounter for general adult medical examination without abnormal findings: Secondary | ICD-10-CM

## 2019-01-05 DIAGNOSIS — E039 Hypothyroidism, unspecified: Secondary | ICD-10-CM

## 2019-01-05 DIAGNOSIS — M85852 Other specified disorders of bone density and structure, left thigh: Secondary | ICD-10-CM

## 2019-01-05 DIAGNOSIS — Z780 Asymptomatic menopausal state: Secondary | ICD-10-CM

## 2019-01-05 DIAGNOSIS — Z13220 Encounter for screening for lipoid disorders: Secondary | ICD-10-CM

## 2019-01-05 DIAGNOSIS — E7800 Pure hypercholesterolemia, unspecified: Secondary | ICD-10-CM

## 2019-01-05 DIAGNOSIS — Z5181 Encounter for therapeutic drug level monitoring: Secondary | ICD-10-CM

## 2019-01-05 NOTE — Telephone Encounter (Signed)
Labs pended.

## 2019-01-05 NOTE — Telephone Encounter (Signed)
Pt called. She scheduled her physical for October 5th and would like lab order sent down for Thursday October 1st.  Thank you.

## 2019-01-06 NOTE — Telephone Encounter (Signed)
Blood labs ordered.  Labs should be done fasting. It looks like you are going to be due for your Pap smear.  If you have not had one at OB/GYN we can do that during her visit with me on the fifth or we could defer it to OB/GYN or 1 of the female providers in clinic.

## 2019-01-06 NOTE — Telephone Encounter (Signed)
Pt is aware. Pt has an appointment with her OB/GYN.  Thanks

## 2019-01-07 ENCOUNTER — Other Ambulatory Visit: Payer: Self-pay

## 2019-01-07 DIAGNOSIS — Z13220 Encounter for screening for lipoid disorders: Secondary | ICD-10-CM

## 2019-01-07 DIAGNOSIS — E7800 Pure hypercholesterolemia, unspecified: Secondary | ICD-10-CM

## 2019-01-07 DIAGNOSIS — E039 Hypothyroidism, unspecified: Secondary | ICD-10-CM

## 2019-01-07 DIAGNOSIS — E559 Vitamin D deficiency, unspecified: Secondary | ICD-10-CM

## 2019-01-07 DIAGNOSIS — Z780 Asymptomatic menopausal state: Secondary | ICD-10-CM

## 2019-01-07 DIAGNOSIS — Z0000 Encounter for general adult medical examination without abnormal findings: Secondary | ICD-10-CM

## 2019-01-07 DIAGNOSIS — M85852 Other specified disorders of bone density and structure, left thigh: Secondary | ICD-10-CM

## 2019-01-07 DIAGNOSIS — Z5181 Encounter for therapeutic drug level monitoring: Secondary | ICD-10-CM

## 2019-01-07 DIAGNOSIS — M85851 Other specified disorders of bone density and structure, right thigh: Secondary | ICD-10-CM

## 2019-01-08 LAB — CBC
HCT: 40.5 % (ref 35.0–45.0)
Hemoglobin: 13.6 g/dL (ref 11.7–15.5)
MCH: 30.1 pg (ref 27.0–33.0)
MCHC: 33.6 g/dL (ref 32.0–36.0)
MCV: 89.6 fL (ref 80.0–100.0)
MPV: 11.2 fL (ref 7.5–12.5)
Platelets: 228 10*3/uL (ref 140–400)
RBC: 4.52 10*6/uL (ref 3.80–5.10)
RDW: 12.1 % (ref 11.0–15.0)
WBC: 3.7 10*3/uL — ABNORMAL LOW (ref 3.8–10.8)

## 2019-01-08 LAB — COMPLETE METABOLIC PANEL WITH GFR
AG Ratio: 1.8 (calc) (ref 1.0–2.5)
ALT: 20 U/L (ref 6–29)
AST: 13 U/L (ref 10–35)
Albumin: 4.6 g/dL (ref 3.6–5.1)
Alkaline phosphatase (APISO): 56 U/L (ref 37–153)
BUN: 14 mg/dL (ref 7–25)
CO2: 27 mmol/L (ref 20–32)
Calcium: 10 mg/dL (ref 8.6–10.4)
Chloride: 102 mmol/L (ref 98–110)
Creat: 0.75 mg/dL (ref 0.50–1.05)
GFR, Est African American: 108 mL/min/{1.73_m2} (ref >=60)
GFR, Est Non African American: 93 mL/min/{1.73_m2} (ref >=60)
Globulin: 2.5 g/dL (calc) (ref 1.9–3.7)
Glucose, Bld: 98 mg/dL (ref 65–99)
Potassium: 4.8 mmol/L (ref 3.5–5.3)
Sodium: 136 mmol/L (ref 135–146)
Total Bilirubin: 0.7 mg/dL (ref 0.2–1.2)
Total Protein: 7.1 g/dL (ref 6.1–8.1)

## 2019-01-08 LAB — LIPID PANEL W/REFLEX DIRECT LDL
Cholesterol: 220 mg/dL — ABNORMAL HIGH (ref <200)
HDL: 69 mg/dL (ref >=50)
LDL Cholesterol (Calc): 131 mg/dL (calc) — ABNORMAL HIGH
Non-HDL Cholesterol (Calc): 151 mg/dL (calc) — ABNORMAL HIGH (ref <130)
Total CHOL/HDL Ratio: 3.2 (calc) (ref <5.0)
Triglycerides: 96 mg/dL (ref <150)

## 2019-01-08 LAB — VITAMIN D 25 HYDROXY (VIT D DEFICIENCY, FRACTURES): Vit D, 25-Hydroxy: 11 ng/mL — ABNORMAL LOW (ref 30–100)

## 2019-01-08 LAB — TSH: TSH: 1.2 mIU/L

## 2019-01-10 ENCOUNTER — Other Ambulatory Visit: Payer: Self-pay | Admitting: Family Medicine

## 2019-01-11 ENCOUNTER — Other Ambulatory Visit: Payer: Self-pay

## 2019-01-11 ENCOUNTER — Ambulatory Visit (INDEPENDENT_AMBULATORY_CARE_PROVIDER_SITE_OTHER): Payer: Managed Care, Other (non HMO) | Admitting: Family Medicine

## 2019-01-11 VITALS — BP 125/83 | HR 90 | Wt 173.0 lb

## 2019-01-11 DIAGNOSIS — Z1211 Encounter for screening for malignant neoplasm of colon: Secondary | ICD-10-CM | POA: Diagnosis not present

## 2019-01-11 DIAGNOSIS — E039 Hypothyroidism, unspecified: Secondary | ICD-10-CM | POA: Diagnosis not present

## 2019-01-11 DIAGNOSIS — Z1231 Encounter for screening mammogram for malignant neoplasm of breast: Secondary | ICD-10-CM

## 2019-01-11 DIAGNOSIS — T8543XA Leakage of breast prosthesis and implant, initial encounter: Secondary | ICD-10-CM

## 2019-01-11 DIAGNOSIS — Z0000 Encounter for general adult medical examination without abnormal findings: Secondary | ICD-10-CM

## 2019-01-11 DIAGNOSIS — M85851 Other specified disorders of bone density and structure, right thigh: Secondary | ICD-10-CM

## 2019-01-11 DIAGNOSIS — E559 Vitamin D deficiency, unspecified: Secondary | ICD-10-CM

## 2019-01-11 DIAGNOSIS — M85852 Other specified disorders of bone density and structure, left thigh: Secondary | ICD-10-CM

## 2019-01-11 MED ORDER — PREDNISONE 10 MG PO TABS: 30.0000 mg | ORAL_TABLET | Freq: Every day | ORAL | 0 refills | Status: DC

## 2019-01-11 NOTE — Progress Notes (Signed)
Christy Burns is a 48 y.o. female who presents to [name] Primary Care Sports Medicine today for well adult visit.   Christy Burns is doing reasonably well overall.  She is trying to exercise regularly and keep healthy diet.  She feels as though she is overall pretty healthy.  She has a follow-up planned with her OB/GYN in November for follow-up cervical cancer screening.  She notes that she is due for breast cancer screening.  However she has a history of bilateral saline breast implants with a small leak in the left implant.  She has been cautioned against traditional mammography to avoid disrupting the implant further.  She does not take vitamin D supplementation at this time.  She does take levothyroxine as below for hypothyroidism.     ROS as above:  Past Medical History:  Diagnosis Date  . Osteopenia 02/05/2018   ASSESSMENT: The BMD measured at Femur Neck Left is 0.775 g/cm2 with a T-score of -1.9. This patient is considered osteopenic according to World Health Organization [facility]) criteria. The scan quality is good.  02/05/18   . Thyroid disease    Past Surgical History:  Procedure Laterality Date  . BREAST SURGERY    . ELBOW FRACTURE SURGERY     Social History   Tobacco Use  . Smoking status: Never Smoker  . Smokeless tobacco: Never Used  Substance Use Topics  . Alcohol use: No    Alcohol/week: 0.0 standard drinks   family history includes Depression in her mother; Diabetes in her father; Hearing loss in her maternal grandfather; Hyperlipidemia in her father; Hypertension in her father.  Medications: Current Outpatient Medications  Medication Sig Dispense Refill  . Cholecalciferol (VITAMIN D-3) 125 MCG (5000 UT) TABS Take by mouth.    . fluticasone (FLONASE) 50 MCG/ACT nasal spray Place 2 sprays into both nostrils daily. 16 g 2  . levothyroxine (SYNTHROID) 88 MCG tablet TAKE 1  TABLET BY MOUTH EVERY DAY 90 tablet 1  . loratadine (CLARITIN) 10 MG tablet TAKE 1 TABLET (10 MG TOTAL) BY MOUTH DAILY. 90 tablet 0  . predniSONE (DELTASONE) 10 MG tablet Take 3 tablets (30 mg total) by mouth daily with breakfast. 15 tablet 0   No current facility-administered medications for this visit.    No Known Allergies  Health Maintenance Health Maintenance  Topic Date Due  . PAP SMEAR-Modifier  11/17/2017  . MAMMOGRAM  05/19/2018  . COLONOSCOPY  05/19/2018  . INFLUENZA VACCINE  11/07/2018  . TETANUS/TDAP  01/30/2025  . HIV Screening  Completed     Exam:  BP 125/83   Pulse 90   Wt 173 lb (78.5 kg)   BMI 28.79 kg/m  Wt Readings from Last 5 Encounters:  01/11/19 173 lb (78.5 kg)  06/18/18 171 lb (77.6 kg)  03/12/18 186 lb (84.4 kg)  12/29/17 179 lb (81.2 kg)  04/22/17 181 lb (82.1 kg)      Gen: Well NAD HEENT: EOMI,  MMM Lungs: Normal work of breathing. CTABL Heart: RRR no MRG Abd: NABS, Soft. Nondistended, Nontender Exts: Brisk capillary refill, warm and well perfused.      Lab and Radiology Results No results found for this or any previous visit (from the past 72 hour(s)). No results found. Recent Results (from the past 2160 hour(s))  CBC     Status: Abnormal   Collection Time: 01/07/19  9:13 AM  Result Value Ref Range   WBC 3.7 (L) 3.8 - 10.8 Thousand/uL  RBC 4.52 3.80 - 5.10 Million/uL   Hemoglobin 13.6 11.7 - 15.5 g/dL   HCT 16.140.5 09.635.0 - 04.545.0 %   MCV 89.6 80.0 - 100.0 fL   MCH 30.1 27.0 - 33.0 pg   MCHC 33.6 32.0 - 36.0 g/dL   RDW 40.912.1 81.111.0 - 91.415.0 %   Platelets 228 140 - 400 Thousand/uL   MPV 11.2 7.5 - 12.5 fL  COMPLETE METABOLIC PANEL WITH GFR     Status: None   Collection Time: 01/07/19  9:13 AM  Result Value Ref Range   Glucose, Bld 98 65 - 99 mg/dL    Comment: .            Fasting reference interval .    BUN 14 7 - 25 mg/dL   Creat 7.820.75 9.560.50 - 2.131.05 mg/dL    Comment: For patients >50 years of age, the reference limit for  Creatinine is approximately 13% higher for people identified as African-American. .    GFR, Est Non African American 93 > OR = 60 mL/min/1.1573m2   GFR, Est African American 108 > OR = 60 mL/min/1.2973m2   BUN/Creatinine Ratio NOT APPLICABLE 6 - 22 (calc)   Sodium 136 135 - 146 mmol/L   Potassium 4.8 3.5 - 5.3 mmol/L   Chloride 102 98 - 110 mmol/L   CO2 27 20 - 32 mmol/L   Calcium 10.0 8.6 - 10.4 mg/dL   Total Protein 7.1 6.1 - 8.1 g/dL   Albumin 4.6 3.6 - 5.1 g/dL   Globulin 2.5 1.9 - 3.7 g/dL (calc)   AG Ratio 1.8 1.0 - 2.5 (calc)   Total Bilirubin 0.7 0.2 - 1.2 mg/dL   Alkaline phosphatase (APISO) 56 37 - 153 U/L   AST 13 10 - 35 U/L   ALT 20 6 - 29 U/L  Lipid Panel w/reflex Direct LDL     Status: Abnormal   Collection Time: 01/07/19  9:13 AM  Result Value Ref Range   Cholesterol 220 (H) <200 mg/dL   HDL 69 > OR = 50 mg/dL   Triglycerides 96 <086<150 mg/dL   LDL Cholesterol (Calc) 131 (H) mg/dL (calc)    Comment: Reference range: <100 . Desirable range <100 mg/dL for primary prevention;   <70 mg/dL for patients with CHD or diabetic patients  with > or = 2 CHD risk factors. Marland Kitchen. LDL-C is now calculated using the Martin-Hopkins  calculation, which is a validated novel method providing  better accuracy than the Friedewald equation in the  estimation of LDL-C.  Horald PollenMartin SS et al. Lenox AhrJAMA. 5784;696(292013;310(19): 2061-2068  (http://education.[facility].com/faq/FAQ164)    Total CHOL/HDL Ratio 3.2 <5.0 (calc)   Non-HDL Cholesterol (Calc) 151 (H) <130 mg/dL (calc)    Comment: For patients with diabetes plus 1 major ASCVD risk  factor, treating to a non-HDL-C goal of <100 mg/dL  (LDL-C of <52<70 mg/dL) is considered a therapeutic  option.   TSH     Status: None   Collection Time: 01/07/19  9:13 AM  Result Value Ref Range   TSH 1.20 mIU/L    Comment:           Reference Range .           > or = 20 Years  0.40-4.50 .                Pregnancy Ranges           First trimester    0.26-2.66  Second trimester   0.55-2.73           Third trimester    0.43-2.91   VITAMIN D 25 Hydroxy (Vit-D Deficiency, Fractures)     Status: Abnormal   Collection Time: 01/07/19 10:27 AM  Result Value Ref Range   Vit D, 25-Hydroxy 11 (L) 30 - 100 ng/mL    Comment: Vitamin D Status         25-OH Vitamin D: . Deficiency:                    <20 ng/mL Insufficiency:             20 - 29 ng/mL Optimal:                 > or = 30 ng/mL . For 25-OH Vitamin D testing on patients on  D2-supplementation and patients for whom quantitation  of D2 and D3 fractions is required, the [facility](TM) 25-OH VIT D, (D2,D3), LC/MS/MS is recommended: order  code [phone] (patients >23yrs). See Note 1 . Note 1 . For additional information, please refer to  http://education.[facility].com/faq/FAQ199  (This link is being provided for informational/ educational purposes only.)       Assessment and Plan: 48 y.o. female with  Well adult.  Doing reasonably well overall.  Only significant lab finding with recent labs a significant vitamin D deficiency.  She does have osteopenia on bone density last year.  Plan to increase vitamin D to 10,000 units over-the-counter D3 daily for 2 months then go to maintenance vitamin D 5000 daily.  Also discussed weight strategies exercise and diet.  We will proceed with Cologuard for colon cancer screening.  Additionally discussed breast cancer screening.  She has a possible leak with her breast implant on her left side.  I discussed the case with radiologist after the visit and we will proceed with diagnostic mammogram and possible ultrasound.  That way we can adjust the imaging to fit both the breast and get the best image quality is possible.  Patient will have follow-up in the near future with her OB/GYN for cervical cancer screening.  Recheck yearly if all is well.  Continue healthy lifestyle.  We will refill short course of prednisone.  Patient is going to  vacation and often gets eustachian tube dysfunction while traveling.  Will use as needed.   PDMP not reviewed this encounter. Orders Placed This Encounter  Procedures  . MM Digital Diagnostic Bilat    Standing Status:   Future    Standing Expiration Date:   03/13/2020    Order Specific Question:   Reason for Exam (SYMPTOM  OR DIAGNOSIS REQUIRED)    Answer:   Breast cancer screening with implant with possible leak    Order Specific Question:   Is the patient pregnant?    Answer:   No    Order Specific Question:   Preferred imaging location?    Answer:   [facility]  . US BREAST COMPLETE UNI LEFT INC AXILLA    Standing Status:   Future    Standing Expiration Date:   03/13/2020    Order Specific Question:   Reason for Exam (SYMPTOM  OR DIAGNOSIS REQUIRED)    Answer:   Breast cancer screening with implant with possible leak    Order Specific Question:   Preferred imaging location?    Answer:   [facility]  . US BREAST COMPLETE UNI RIGHT INC AXILLA    Standing Status:  Future    Standing Expiration Date:   03/13/2020    Order Specific Question:   Reason for Exam (SYMPTOM  OR DIAGNOSIS REQUIRED)    Answer:   Breast cancer screening with implant with possible leak.    Order Specific Question:   Preferred imaging location?    Answer:   [facility]   Meds ordered this encounter  Medications  . predniSONE (DELTASONE) 10 MG tablet    Sig: Take 3 tablets (30 mg total) by mouth daily with breakfast.    Dispense:  15 tablet    Refill:  0     Discussed warning signs or symptoms. Please see discharge instructions. Patient expresses understanding.

## 2019-01-11 NOTE — Patient Instructions (Signed)
Thank you for coming in today.  You should hear from cologuard soon.   Cologuard is covered by Medicare and most major insurers. INSURANCE Cologuard is covered by Medicare and Medicare Advantage with no co-pay or deductible for eligible patients.  More than 92%* of all Cologuard patients have no out of-pocket cost for screening.  Based on the [place] should be covered by most private insurers with no co-pay or deductible for eligible patients (ages 68-75; at average risk for colon cancer; without symptoms). Some exceptions may apply, so we recommend patients call their insurer to confirm.  BILLING Call Cologuard at [phone] and speak with a member of our Patient Support Team to make sure the test will be covered.

## 2019-01-12 ENCOUNTER — Encounter: Payer: Self-pay | Admitting: Family Medicine

## 2019-01-29 ENCOUNTER — Encounter: Payer: Self-pay | Admitting: Family Medicine

## 2019-07-04 ENCOUNTER — Other Ambulatory Visit: Payer: Self-pay | Admitting: Family Medicine

## 2019-07-07 ENCOUNTER — Encounter: Payer: Self-pay | Admitting: Emergency Medicine

## 2019-07-07 ENCOUNTER — Emergency Department (INDEPENDENT_AMBULATORY_CARE_PROVIDER_SITE_OTHER): Payer: 59

## 2019-07-07 ENCOUNTER — Other Ambulatory Visit: Payer: Self-pay

## 2019-07-07 ENCOUNTER — Emergency Department (INDEPENDENT_AMBULATORY_CARE_PROVIDER_SITE_OTHER)
Admission: EM | Admit: 2019-07-07 | Discharge: 2019-07-07 | Disposition: A | Discharge status: Home / Self Care | Payer: 59 | Source: Home / Self Care | Attending: Family Medicine | Admitting: Family Medicine

## 2019-07-07 DIAGNOSIS — S93601A Unspecified sprain of right foot, initial encounter: Secondary | ICD-10-CM | POA: Diagnosis not present

## 2019-07-07 DIAGNOSIS — S93401A Sprain of unspecified ligament of right ankle, initial encounter: Secondary | ICD-10-CM

## 2019-07-07 DIAGNOSIS — M25571 Pain in right ankle and joints of right foot: Secondary | ICD-10-CM

## 2019-07-07 NOTE — Discharge Instructions (Addendum)
Apply ice pack for 30 minutes 2 to 3 times daily until swelling resolves.  Elevate.  Use crutches for 3 to 5 days.  Wear Ace wrap and cam walker until swelling decreases.  After weaning from crutches, wear AirCast splint for about 3 to 4 weeks.  Begin range of motion and stretching exercises in about 5 days as per instruction sheet.  May take Ibuprofen 200mg , 4 tabs every 8 hours with food.  If needed, may take Tylenol for pain. Recommend followup with family doctor to repeat vitamin D level.

## 2019-07-07 NOTE — ED Triage Notes (Signed)
Tripped on dog's ball on stairs last night at 10:30pm immediate pain to outside of R ankle- swelling & bruising to R ankle - iced and elevated -motrin at [facility] on from previous injury

## 2019-07-07 NOTE — ED Provider Notes (Signed)
[facility]    CSN: [id number] Arrival date & time: 07/07/19  0840      History   Chief Complaint Chief Complaint  Patient presents with  . Ankle Injury    R     HPI Christy Burns is a 49 y.o. female.   Patient tripped on her dog's ball on stairs last night, twisting her right ankle/foot.  She has had persistent pain/swelling resulting in difficulty bearing weight.  She has been wearing a cam walker from a previous ankle injury.  The history is provided by the patient.  Ankle Pain Location:  Ankle and foot Time since incident:  1 day Injury: yes   Mechanism of injury comment:  Twisted ankle Ankle location:  R ankle Foot location:  R foot Pain details:    Quality:  Aching   Radiates to:  Does not radiate   Severity:  Moderate   Onset quality:  Sudden   Duration:  1 day   Timing:  Constant   Progression:  Unchanged Chronicity:  New Prior injury to area:  Yes Relieved by:  Nothing Worsened by:  Bearing weight and activity Ineffective treatments:  NSAIDs and ice Associated symptoms: decreased ROM, stiffness and swelling   Associated symptoms: no muscle weakness, no numbness and no tingling     Past Medical History:  Diagnosis Date  . Osteopenia 02/05/2018   ASSESSMENT: The BMD measured at Femur Neck Left is 0.775 g/cm2 with a T-score of -1.9. This patient is considered osteopenic according to World Health Organization [facility]) criteria. The scan quality is good.  02/05/18   . Thyroid disease     Patient Active Problem List   Diagnosis Date Noted  . Osteopenia 02/05/2018  . Vitamin D deficiency 02/05/2018  . Post-menopausal 12/29/2017  . Ingrown left big toenail 01/31/2015  . Hypothyroidism 01/31/2015    Past Surgical History:  Procedure Laterality Date  . BREAST SURGERY    . ELBOW FRACTURE SURGERY    . WRIST SURGERY Right 2017    OB History   No obstetric history on file.      Home Medications    Prior to Admission medications     Medication Sig Start Date End Date Taking? Authorizing Provider  Cetirizine HCl 10 MG CAPS Take by mouth.   Yes [provider]  Cholecalciferol (VITAMIN D-3) 125 MCG (5000 UT) TABS Take by mouth.   Yes [provider]  fluticasone (FLONASE) 50 MCG/ACT nasal spray Place 2 sprays into both nostrils daily. 12/27/15  Yes Rodolph Bong, MD  levothyroxine (SYNTHROID) 88 MCG tablet Take 1 tablet (88 mcg total) by mouth daily. Needs appt to est care with new PCP 07/06/19  Yes Everrett Coombe, DO  loratadine (CLARITIN) 10 MG tablet TAKE 1 TABLET (10 MG TOTAL) BY MOUTH DAILY. 08/14/17   Rodolph Bong, MD  predniSONE (DELTASONE) 10 MG tablet Take 3 tablets (30 mg total) by mouth daily with breakfast. 01/11/19   Rodolph Bong, MD    Family History Family History  Problem Relation Age of Onset  . Depression Mother   . Diabetes Father   . Hyperlipidemia Father   . Hypertension Father   . Hearing loss Maternal Grandfather     Social History Social History   Tobacco Use  . Smoking status: Never Smoker  . Smokeless tobacco: Never Used  Substance Use Topics  . Alcohol use: No    Alcohol/week: 0.0 standard drinks  . Drug use: Not on file  Allergies   Patient has no known allergies.   Review of Systems Review of Systems  Musculoskeletal: Positive for stiffness.  All other systems reviewed and are negative.    Physical Exam Triage Vital Signs ED Triage Vitals  Enc Vitals Group     BP 07/07/19 0854 107/77     Pulse Rate 07/07/19 0854 82     Resp 07/07/19 0854 18     Temp 07/07/19 0854 99 F (37.2 C)     Temp Source 07/07/19 0854 Oral     SpO2 07/07/19 0854 98 %     Weight --      Height --      Head Circumference --      Peak Flow --      Pain Score 07/07/19 0855 4     Pain Loc --      Pain Edu? --      Excl. in GC? --    No data found.  Updated Vital Signs BP 107/77 (BP Location: Right Arm)   Pulse 82   Temp 99 F (37.2 C) (Oral)   Resp 18   SpO2  98%   Visual Acuity Right Eye Distance:   Left Eye Distance:   Bilateral Distance:    Right Eye Near:   Left Eye Near:    Bilateral Near:     Physical Exam Vitals and nursing note reviewed.  Constitutional:      General: She is not in acute distress. HENT:     Head: Atraumatic.  Eyes:     Pupils: Pupils are equal, round, and reactive to light.  Cardiovascular:     Rate and Rhythm: Normal rate.  Pulmonary:     Effort: Pulmonary effort is normal.  Musculoskeletal:       Feet:     Comments: Right ankle:  Decreased range of motion.  Tenderness, swelling, and ecchymosis over the lateral malleolus.  There is localized tenderness above the medial malleolus. Joint stable.  No tenderness over the base of the fifth metatarsal.  Distal neurovascular function is intact. Lateral ankle tenderness extends to the lateral foot as noted on diagram.       Skin:    General: Skin is warm and dry.  Neurological:     Mental Status: She is alert.      UC Treatments / Results  Labs (all labs ordered are listed, but only abnormal results are displayed) Labs Reviewed - No data to display  EKG   Radiology DG Ankle Complete Right  Result Date: 07/07/2019 CLINICAL DATA:  Lateral ankle pain after fall EXAM: RIGHT ANKLE - COMPLETE 3+ VIEW COMPARISON:  06/19/2015. FINDINGS: There is no evidence of fracture, dislocation, or joint effusion. Bidirectional calcaneal enthesophytes. There is no evidence of arthropathy or other focal bone abnormality. Soft tissues are unremarkable. IMPRESSION: Negative. Electronically Signed   By: Duanne Guess D.O.   On: 07/07/2019 09:25   DG Foot Complete Right  Result Date: 07/07/2019 CLINICAL DATA:  Pain following fall EXAM: RIGHT FOOT COMPLETE - 3+ VIEW COMPARISON:  June 19, 2015. FINDINGS: Frontal, oblique, and lateral views were obtained. No fracture or dislocation. 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 Other joint spaces appear  normal. No erosive change. There are posterior and inferior calcaneal spurs. IMPRESSION: No fracture or dislocation. 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 There are calcaneal spurs. Electronically Signed   By: Bretta Bang III M.D.   On: 07/07/2019 09:54  Procedures Procedures (including critical care time)  Medications Ordered in UC Medications - No data to display  Initial Impression / Assessment and Plan / UC Course  I have reviewed the triage vital signs and the nursing notes.  Pertinent labs & imaging results that were available during my care of the patient were reviewed by me and considered in my medical decision making (see chart for details).    Ace wrap applied.  Dispensed crutches and AirCast splint.  May continue Cam walker while using crutches. Followup with Dr. Aundria Mems ([facility]) if not improving about two weeks.   Note history of significant Vitamin D deficiency.  Recommend follow-up with PCP for repeat Vitamin D level. Consider follow-up with Gertha Calkin, [place] [facility].   Final Clinical Impressions(s) / UC Diagnoses   Final diagnoses:  Sprain of right ankle, unspecified ligament, initial encounter  Right foot sprain, initial encounter     Discharge Instructions     Apply ice pack for 30 minutes 2 to 3 times daily until swelling resolves.  Elevate.  Use crutches for 3 to 5 days.  Wear Ace wrap and cam walker until swelling decreases.  After weaning from crutches, wear AirCast splint for about 3 to 4 weeks.  Begin range of motion and stretching exercises in about 5 days as per instruction sheet.  May take Ibuprofen 200mg , 4 tabs every 8 hours with food.  If needed, may take Tylenol for pain. Recommend followup with family doctor to repeat vitamin D level.    ED Prescriptions    None        Kandra Nicolas, MD 07/09/19 1057

## 2019-10-05 ENCOUNTER — Other Ambulatory Visit: Payer: Self-pay | Admitting: Family Medicine

## 2019-11-12 ENCOUNTER — Other Ambulatory Visit: Payer: Self-pay | Admitting: Family Medicine

## 2019-11-22 ENCOUNTER — Encounter: Payer: Self-pay | Admitting: Family Medicine

## 2019-11-23 MED ORDER — LEVOTHYROXINE SODIUM 88 MCG PO TABS: 88.0000 ug | ORAL_TABLET | Freq: Every day | ORAL | 0 refills | Status: DC

## 2020-01-14 ENCOUNTER — Telehealth: Payer: Self-pay

## 2020-01-14 NOTE — Telephone Encounter (Signed)
Patient is scheduled to see you on Wednesday morning.  She would like to have her labs drawn prior.  She has a form for work that requires this information be completed that she will be bringing to her appointment.

## 2020-01-17 ENCOUNTER — Other Ambulatory Visit: Payer: Self-pay | Admitting: Family Medicine

## 2020-01-17 DIAGNOSIS — E039 Hypothyroidism, unspecified: Secondary | ICD-10-CM

## 2020-01-17 DIAGNOSIS — Z0000 Encounter for general adult medical examination without abnormal findings: Secondary | ICD-10-CM

## 2020-01-17 DIAGNOSIS — Z13220 Encounter for screening for lipoid disorders: Secondary | ICD-10-CM

## 2020-01-17 DIAGNOSIS — E559 Vitamin D deficiency, unspecified: Secondary | ICD-10-CM

## 2020-01-17 NOTE — Telephone Encounter (Signed)
Orders entered for labs

## 2020-01-19 ENCOUNTER — Ambulatory Visit: Payer: 59 | Admitting: Family Medicine

## 2020-01-25 LAB — COMPLETE METABOLIC PANEL WITHOUT GFR
AG Ratio: 2 (calc) (ref 1.0–2.5)
ALT: 17 U/L (ref 6–29)
AST: 13 U/L (ref 10–35)
Albumin: 4.5 g/dL (ref 3.6–5.1)
Alkaline phosphatase (APISO): 64 U/L (ref 37–153)
BUN: 17 mg/dL (ref 7–25)
CO2: 24 mmol/L (ref 20–32)
Calcium: 9.6 mg/dL (ref 8.6–10.4)
Chloride: 105 mmol/L (ref 98–110)
Creat: 0.78 mg/dL (ref 0.50–1.05)
GFR, Est African American: 102 mL/min/1.73m2
GFR, Est Non African American: 88 mL/min/1.73m2
Globulin: 2.3 g/dL (ref 1.9–3.7)
Glucose, Bld: 93 mg/dL (ref 65–99)
Potassium: 4.4 mmol/L (ref 3.5–5.3)
Sodium: 140 mmol/L (ref 135–146)
Total Bilirubin: 0.7 mg/dL (ref 0.2–1.2)
Total Protein: 6.8 g/dL (ref 6.1–8.1)

## 2020-01-25 LAB — VITAMIN D 25 HYDROXY (VIT D DEFICIENCY, FRACTURES): Vit D, 25-Hydroxy: 37 ng/mL (ref 30–100)

## 2020-01-25 LAB — CBC
HCT: 38.3 % (ref 35.0–45.0)
Hemoglobin: 13.2 g/dL (ref 11.7–15.5)
MCH: 31.4 pg (ref 27.0–33.0)
MCHC: 34.5 g/dL (ref 32.0–36.0)
MCV: 91 fL (ref 80.0–100.0)
MPV: 11.6 fL (ref 7.5–12.5)
Platelets: 217 Thousand/uL (ref 140–400)
RBC: 4.21 Million/uL (ref 3.80–5.10)
RDW: 11.8 % (ref 11.0–15.0)
WBC: 3.5 Thousand/uL — ABNORMAL LOW (ref 3.8–10.8)

## 2020-01-25 LAB — LIPID PANEL
Cholesterol: 214 mg/dL — ABNORMAL HIGH
HDL: 65 mg/dL
LDL Cholesterol (Calc): 129 mg/dL — ABNORMAL HIGH
Non-HDL Cholesterol (Calc): 149 mg/dL — ABNORMAL HIGH
Total CHOL/HDL Ratio: 3.3 (calc)
Triglycerides: 98 mg/dL

## 2020-01-25 LAB — TSH: TSH: 2.99 m[IU]/L

## 2020-02-01 ENCOUNTER — Ambulatory Visit (INDEPENDENT_AMBULATORY_CARE_PROVIDER_SITE_OTHER): Payer: 59 | Admitting: Family Medicine

## 2020-02-01 ENCOUNTER — Encounter: Payer: Self-pay | Admitting: Family Medicine

## 2020-02-01 ENCOUNTER — Other Ambulatory Visit: Payer: Self-pay

## 2020-02-01 VITALS — BP 121/71 | HR 65 | Temp 98.0°F | Ht 64.57 in | Wt 180.0 lb

## 2020-02-01 DIAGNOSIS — Z1231 Encounter for screening mammogram for malignant neoplasm of breast: Secondary | ICD-10-CM | POA: Diagnosis not present

## 2020-02-01 DIAGNOSIS — T8543XA Leakage of breast prosthesis and implant, initial encounter: Secondary | ICD-10-CM

## 2020-02-01 DIAGNOSIS — Z0000 Encounter for general adult medical examination without abnormal findings: Secondary | ICD-10-CM | POA: Diagnosis not present

## 2020-02-01 DIAGNOSIS — Z1211 Encounter for screening for malignant neoplasm of colon: Secondary | ICD-10-CM

## 2020-02-01 DIAGNOSIS — Z9882 Breast implant status: Secondary | ICD-10-CM | POA: Diagnosis not present

## 2020-02-01 MED ORDER — PREDNISONE 10 MG PO TABS: 30.0000 mg | ORAL_TABLET | Freq: Every day | ORAL | 0 refills | Status: DC

## 2020-02-01 NOTE — Patient Instructions (Signed)
Preventive Care 40-51 Years Old Old, Female Preventive care refers to visits with your health care provider and lifestyle choices that can promote health and wellness. This includes:  A yearly physical exam. This may also be called an annual well check.  Regular dental visits and eye exams.  Immunizations.  Screening for certain conditions.  Healthy lifestyle choices, such as eating a healthy diet, getting regular exercise, not using drugs or products that contain nicotine and tobacco, and limiting alcohol use. What can I expect for my preventive care visit? Physical exam Your health care provider will check your:  Height and weight. This may be used to calculate body mass index (BMI), which tells if you are at a healthy weight.  Heart rate and blood pressure.  Skin for abnormal spots. Counseling Your health care provider may ask you questions about your:  Alcohol, tobacco, and drug use.  Emotional well-being.  Home and relationship well-being.  Sexual activity.  Eating habits.  Work and work environment.  Method of birth control.  Menstrual cycle.  Pregnancy history. What immunizations do I need?  Influenza (flu) vaccine  This is recommended every year. Tetanus, diphtheria, and pertussis (Tdap) vaccine  You may need a Td booster every 10 years. Varicella (chickenpox) vaccine  You may need this if you have not been vaccinated. Zoster (shingles) vaccine  You may need this after age 51. Measles, mumps, and rubella (MMR) vaccine  You may need at least one dose of MMR if you were born in 1957 or later. You may also need a second dose. Pneumococcal conjugate (PCV13) vaccine  You may need this if you have certain conditions and were not previously vaccinated. Pneumococcal polysaccharide (PPSV23) vaccine  You may need one or two doses if you smoke cigarettes or if you have certain conditions. Meningococcal conjugate (MenACWY) vaccine  You may need this if you  have certain conditions. Hepatitis A vaccine  You may need this if you have certain conditions or if you travel or work in places where you may be exposed to hepatitis A. Hepatitis B vaccine  You may need this if you have certain conditions or if you travel or work in places where you may be exposed to hepatitis B. Haemophilus influenzae type b (Hib) vaccine  You may need this if you have certain conditions. Human papillomavirus (HPV) vaccine  If recommended by your health care provider, you may need three doses over 6 months. You may receive vaccines as individual doses or as more than one vaccine together in one shot (combination vaccines). Talk with your health care provider about the risks and benefits of combination vaccines. What tests do I need? Blood tests  Lipid and cholesterol levels. These may be checked every 5 years, or more frequently if you are over 51 years old.  Hepatitis C test.  Hepatitis B test. Screening  Lung cancer screening. You may have this screening every year starting at age 51 if you have a 30-pack-year history of smoking and currently smoke or have quit within the past 15 years.  Colorectal cancer screening. All adults should have this screening starting at age 51 and continuing until age 51. Your health care provider may recommend screening at age 45 if you are at increased risk. You will have tests every 1-10 years, depending on your results and the type of screening test.  Diabetes screening. This is done by checking your blood sugar (glucose) after you have not eaten for a while (fasting). You may have this   done every 1-3 years.  Mammogram. This may be done every 1-2 years. Talk with your health care provider about when you should start having regular mammograms. This may depend on whether you have a family history of breast cancer.  BRCA-related cancer screening. This may be done if you have a family history of breast, ovarian, tubal, or peritoneal  cancers.  Pelvic exam and Pap test. This may be done every 3 years starting at age 51. Starting at age 51, this may be done every 5 years if you have a Pap test in combination with an HPV test. Other tests  Sexually transmitted disease (STD) testing.  Bone density scan. This is done to screen for osteoporosis. You may have this scan if you are at high risk for osteoporosis. Follow these instructions at home: Eating and drinking  Eat a diet that includes fresh fruits and vegetables, whole grains, lean protein, and low-fat dairy.  Take vitamin and mineral supplements as recommended by your health care provider.  Do not drink alcohol if: ? Your health care provider tells you not to drink. ? You are pregnant, may be pregnant, or are planning to become pregnant.  If you drink alcohol: ? Limit how much you have to 0-1 drink a day. ? Be aware of how much alcohol is in your drink. In the [place] one drink equals one 12 oz bottle of beer (355 mL), one 5 oz glass of wine (148 mL), or one 1 oz glass of hard liquor (44 mL). Lifestyle  Take daily care of your teeth and gums.  Stay active. Exercise for at least 30 minutes on 5 or more days each week.  Do not use any products that contain nicotine or tobacco, such as cigarettes, e-cigarettes, and chewing tobacco. If you need help quitting, ask your health care provider.  If you are sexually active, practice safe sex. Use a condom or other form of birth control (contraception) in order to prevent pregnancy and STIs (sexually transmitted infections).  If told by your health care provider, take low-dose aspirin daily starting at age 51. What's next?  Visit your health care provider once a year for a well check visit.  Ask your health care provider how often you should have your eyes and teeth checked.  Stay up to date on all vaccines. This information is not intended to replace advice given to you by your health care provider. Make sure you  discuss any questions you have with your health care provider. Document Revised: 12/04/2017 Document Reviewed: 12/04/2017 Elsevier Patient Education  2020 [employer].

## 2020-02-01 NOTE — Progress Notes (Signed)
Christy Burns - 49 y.o. female MRN [id number]  Date of birth: 1970/08/06  Subjective Chief Complaint  Patient presents with  . Annual Exam    HPI Christy Burns is a 49 y.o. female here today for annual exam.  She has a history of hypothyroidism.  She had labs drawn prior to visit today which we reviewed in detail today.    She exercises 4-5 days per week and follows a healthy diet.   She is a non-smoker and denies EtOH use  She has not had COVID vaccine but is strongly considering getting this.  She plans to have flu vaccine at [employer].    She is due for pap but would like to defer this at this time.   Review of Systems  Constitutional: Negative for chills, fever, malaise/fatigue and weight loss.  HENT: Negative for congestion, ear pain and sore throat.   Eyes: Negative for blurred vision, double vision and pain.  Respiratory: Negative for cough and shortness of breath.   Cardiovascular: Negative for chest pain and palpitations.  Gastrointestinal: Negative for abdominal pain, blood in stool, constipation, heartburn and nausea.  Genitourinary: Negative for dysuria and urgency.  Musculoskeletal: Negative for joint pain and myalgias.  Neurological: Negative for dizziness and headaches.  Endo/Heme/Allergies: Does not bruise/bleed easily.  Psychiatric/Behavioral: Negative for depression. The patient is not nervous/anxious and does not have insomnia.      No Known Allergies  Past Medical History:  Diagnosis Date  . Osteopenia 02/05/2018   ASSESSMENT: The BMD measured at Femur Neck Left is 0.775 g/cm2 with a T-score of -1.9. This patient is considered osteopenic according to World Health Organization [facility]) criteria. The scan quality is good.  02/05/18   . Thyroid disease     Past Surgical History:  Procedure Laterality Date  . BREAST SURGERY    . ELBOW FRACTURE SURGERY    . WRIST SURGERY Right 2017    Social History   Socioeconomic History  . Marital status: Married     Spouse name: Not on file  . Number of children: Not on file  . Years of education: Not on file  . Highest education level: Not on file  Occupational History  . Not on file  Tobacco Use  . Smoking status: Never Smoker  . Smokeless tobacco: Never Used  Vaping Use  . Vaping Use: Never used  Substance and Sexual Activity  . Alcohol use: No    Alcohol/week: 0.0 standard drinks  . Drug use: Not on file  . Sexual activity: Yes    Partners: Male  Other Topics Concern  . Not on file  Social History Narrative  . Not on file   Social Determinants of Health   Financial Resource Strain:   . Difficulty of Paying Living Expenses: Not on file  Food Insecurity:   . Worried About Programme researcher, broadcasting/film/video in the Last Year: Not on file  . Ran Out of Food in the Last Year: Not on file  Transportation Needs:   . Lack of Transportation (Medical): Not on file  . Lack of Transportation (Non-Medical): Not on file  Physical Activity:   . Days of Exercise per Week: Not on file  . Minutes of Exercise per Session: Not on file  Stress:   . Feeling of Stress : Not on file  Social Connections:   . Frequency of Communication with Friends and Family: Not on file  . Frequency of Social Gatherings with Friends and Family: Not on file  .  Attends Religious Services: Not on file  . Active Member of Clubs or Organizations: Not on file  . Attends Banker Meetings: Not on file  . Marital Status: Not on file    Family History  Problem Relation Age of Onset  . Depression Mother   . Diabetes Father   . Hyperlipidemia Father   . Hypertension Father   . Hearing loss Maternal Grandfather     Health Maintenance  Topic Date Due  . Hepatitis C Screening  Never done  . PAP SMEAR-Modifier  11/17/2017  . MAMMOGRAM  Never done  . Fecal DNA (Cologuard)  Never done  . COVID-19 Vaccine (1) 04/08/2020 (Originally 05/19/1980)  . INFLUENZA VACCINE  07/06/2020 (Originally 11/07/2019)  . TETANUS/TDAP  01/30/2025   . HIV Screening  Completed     ----------------------------------------------------------------------------------------------------------------------------------------------------------------------------------------------------------------- Physical Exam BP 121/71 (BP Location: Left Arm, Patient Position: Sitting, Cuff Size: Normal)   Pulse 65   Temp 98 F (36.7 C) (Oral)   Ht 5' 4.57" (1.64 m)   Wt 180 lb (81.6 kg)   SpO2 97%   BMI 30.36 kg/m   Physical Exam Constitutional:      General: She is not in acute distress. HENT:     Head: Normocephalic and atraumatic.     Nose: Nose normal.  Eyes:     General: No scleral icterus.    Conjunctiva/sclera: Conjunctivae normal.  Neck:     Thyroid: No thyromegaly.  Cardiovascular:     Rate and Rhythm: Normal rate and regular rhythm.     Heart sounds: Normal heart sounds.  Pulmonary:     Effort: Pulmonary effort is normal.     Breath sounds: Normal breath sounds.  Abdominal:     General: Bowel sounds are normal. There is no distension.     Palpations: Abdomen is soft.     Tenderness: There is no abdominal tenderness. There is no guarding.  Musculoskeletal:        General: Normal range of motion.     Cervical back: Normal range of motion and neck supple.  Lymphadenopathy:     Cervical: No cervical adenopathy.  Skin:    General: Skin is warm and dry.     Findings: No rash.  Neurological:     Mental Status: She is alert and oriented to person, place, and time.     Cranial Nerves: No cranial nerve deficit.     Coordination: Coordination normal.  Psychiatric:        Behavior: Behavior normal.     ------------------------------------------------------------------------------------------------------------------------------------------------------------------------------------------------------------------- Assessment and Plan  Well adult exam Well adult Labs reviewed Screening:  Pap deferred.  Diagnostic mammo and [place]  ordered with history of breast implant with small leak.  Cologuard ordered.  Immunizations:  Plans to get COVID and flu vaccines.  Anticipatory guidance/Risk factor reduction:  Recommendations per AVS   Meds ordered this encounter  Medications  . predniSONE (DELTASONE) 10 MG tablet    Sig: Take 3 tablets (30 mg total) by mouth daily with breakfast.    Dispense:  15 tablet    Refill:  0    No follow-ups on file.    This visit occurred during the SARS-CoV-2 public health emergency.  Safety protocols were in place, including screening questions prior to the visit, additional usage of staff PPE, and extensive cleaning of exam room while observing appropriate contact time as indicated for disinfecting solutions.

## 2020-02-01 NOTE — Assessment & Plan Note (Signed)
Well adult Labs reviewed Screening:  Pap deferred.  Diagnostic mammo and [place] ordered with history of breast implant with small leak.  Cologuard ordered.  Immunizations:  Plans to get COVID and flu vaccines.  Anticipatory guidance/Risk factor reduction:  Recommendations per AVS

## 2020-02-02 ENCOUNTER — Other Ambulatory Visit: Payer: Self-pay | Admitting: Family Medicine

## 2020-02-28 ENCOUNTER — Telehealth: Payer: Self-pay | Admitting: Family Medicine

## 2020-02-28 NOTE — Telephone Encounter (Signed)
Please advise the patient that she will need to keep her appointment tomorrow. Thanks

## 2020-02-28 NOTE — Telephone Encounter (Signed)
Patient advised to keep appt in the morning. AM

## 2020-02-28 NOTE — Telephone Encounter (Signed)
Patient called, stating that she had chronic right ear problems and she was seen by you about a month ago for this issue, and it is not getting any better/getting a little worse. She was questioning could something be called in for this or if she needed a visit? I went ahead and scheduled a visit for in the morning at 10:40am so the slot wouldn't get taken but she said if something could be called in instead, that would be great. I let patient know I would pass the world along and she was agreeable and said she would keep the appt if need to be. AM

## 2020-02-29 ENCOUNTER — Encounter: Payer: Self-pay | Admitting: Family Medicine

## 2020-02-29 ENCOUNTER — Ambulatory Visit (INDEPENDENT_AMBULATORY_CARE_PROVIDER_SITE_OTHER): Payer: 59 | Admitting: Family Medicine

## 2020-02-29 DIAGNOSIS — H66001 Acute suppurative otitis media without spontaneous rupture of ear drum, right ear: Secondary | ICD-10-CM | POA: Insufficient documentation

## 2020-02-29 MED ORDER — PREDNISONE 10 MG PO TABS: 30.0000 mg | ORAL_TABLET | Freq: Every day | ORAL | 0 refills | Status: DC

## 2020-02-29 MED ORDER — AMOXICILLIN-POT CLAVULANATE 875-125 MG PO TABS: 1.0000 | ORAL_TABLET | Freq: Two times a day (BID) | ORAL | 0 refills | Status: DC

## 2020-02-29 MED ORDER — FLUCONAZOLE 150 MG PO TABS: 150.0000 mg | ORAL_TABLET | Freq: Once | ORAL | 1 refills | Status: AC

## 2020-02-29 NOTE — Progress Notes (Signed)
Christy Burns - 49 y.o. female MRN [id number]  Date of birth: 1970/11/05  Subjective Chief Complaint  Patient presents with  . Ear Pain    HPI Christy Burns is a 49 y.o. female here today with complaint of R ear pain.  She has had problems with her R ear for a few weeks.  Has had sensation of ear fullness and fluid in her ears. She has had some allergy and sinus congestion and she had some temporary improvement in her ear fullness with course of prednisone last month.  She is also using flonase, mucinex and antihistamine.  She denies drainage from the ear, fever, chills, sinus pain, or dizziness.   ROS:  A comprehensive ROS was completed and negative except as noted per HPI  No Known Allergies  Past Medical History:  Diagnosis Date  . Osteopenia 02/05/2018   ASSESSMENT: The BMD measured at Femur Neck Left is 0.775 g/cm2 with a T-score of -1.9. This patient is considered osteopenic according to World Health Organization [facility]) criteria. The scan quality is good.  02/05/18   . Thyroid disease     Past Surgical History:  Procedure Laterality Date  . BREAST SURGERY    . ELBOW FRACTURE SURGERY    . WRIST SURGERY Right 2017    Social History   Socioeconomic History  . Marital status: Married    Spouse name: Not on file  . Number of children: Not on file  . Years of education: Not on file  . Highest education level: Not on file  Occupational History  . Not on file  Tobacco Use  . Smoking status: Never Smoker  . Smokeless tobacco: Never Used  Vaping Use  . Vaping Use: Never used  Substance and Sexual Activity  . Alcohol use: No    Alcohol/week: 0.0 standard drinks  . Drug use: Not on file  . Sexual activity: Yes    Partners: Male  Other Topics Concern  . Not on file  Social History Narrative  . Not on file   Social Determinants of Health   Financial Resource Strain:   . Difficulty of Paying Living Expenses: Not on file  Food Insecurity:   . Worried About Patent examiner in the Last Year: Not on file  . Ran Out of Food in the Last Year: Not on file  Transportation Needs:   . Lack of Transportation (Medical): Not on file  . Lack of Transportation (Non-Medical): Not on file  Physical Activity:   . Days of Exercise per Week: Not on file  . Minutes of Exercise per Session: Not on file  Stress:   . Feeling of Stress : Not on file  Social Connections:   . Frequency of Communication with Friends and Family: Not on file  . Frequency of Social Gatherings with Friends and Family: Not on file  . Attends Religious Services: Not on file  . Active Member of Clubs or Organizations: Not on file  . Attends Banker Meetings: Not on file  . Marital Status: Not on file    Family History  Problem Relation Age of Onset  . Depression Mother   . Diabetes Father   . Hyperlipidemia Father   . Hypertension Father   . Hearing loss Maternal Grandfather     Health Maintenance  Topic Date Due  . Hepatitis C Screening  Never done  . PAP SMEAR-Modifier  11/17/2017  . MAMMOGRAM  Never done  . Fecal DNA (Cologuard)  Never  done  . COVID-19 Vaccine (1) 04/08/2020 (Originally 05/19/1980)  . INFLUENZA VACCINE  07/06/2020 (Originally 11/07/2019)  . TETANUS/TDAP  01/30/2025  . HIV Screening  Completed     ----------------------------------------------------------------------------------------------------------------------------------------------------------------------------------------------------------------- Physical Exam BP 123/77 (BP Location: Left Arm, Patient Position: Sitting, Cuff Size: Normal)   Pulse 64   Temp 97.8 F (36.6 C)   Wt 182 lb 14.4 oz (83 kg)   SpO2 99%   BMI 30.85 kg/m   Physical Exam Constitutional:      Appearance: Normal appearance.  HENT:     Head: Normocephalic and atraumatic.     Right Ear: A middle ear effusion is present. Tympanic membrane is erythematous.     Left Ear: Tympanic membrane normal.  No middle ear  effusion. Tympanic membrane is not erythematous.  Eyes:     General: No scleral icterus. Cardiovascular:     Rate and Rhythm: Normal rate and regular rhythm.  Musculoskeletal:     Cervical back: Neck supple.  Neurological:     General: No focal deficit present.     Mental Status: She is alert.  Psychiatric:        Mood and Affect: Mood normal.        Behavior: Behavior normal.     ------------------------------------------------------------------------------------------------------------------------------------------------------------------------------------------------------------------- Assessment and Plan  Right acute suppurative otitis media Treat with augmentin x10 days.  She had improvement of eustachian tube dysfunction with prednisone previously and will add additional course of this.  Diflucan as needed if she develops antibiotic associated yeast infection.  F/u in 3 weeks to be sure effusion is resolving.     Meds ordered this encounter  Medications  . amoxicillin-clavulanate (AUGMENTIN) 875-125 MG tablet    Sig: Take 1 tablet by mouth 2 (two) times daily.    Dispense:  20 tablet    Refill:  0  . predniSONE (DELTASONE) 10 MG tablet    Sig: Take 3 tablets (30 mg total) by mouth daily with breakfast.    Dispense:  15 tablet    Refill:  0  . fluconazole (DIFLUCAN) 150 MG tablet    Sig: Take 1 tablet (150 mg total) by mouth once for 1 dose. May repeat if needed in 72 hours.    Dispense:  1 tablet    Refill:  1    Return in about 3 weeks (around 03/21/2020) for f/u Ear.    This visit occurred during the SARS-CoV-2 public health emergency.  Safety protocols were in place, including screening questions prior to the visit, additional usage of staff PPE, and extensive cleaning of exam room while observing appropriate contact time as indicated for disinfecting solutions.

## 2020-02-29 NOTE — Patient Instructions (Signed)
Otitis Media, Adult  Otitis media means that the middle ear is red and swollen (inflamed) and full of fluid. The condition usually goes away on its own. Follow these instructions at home:  Take over-the-counter and prescription medicines only as told by your doctor.  If you were prescribed an antibiotic medicine, take it as told by your doctor. Do not stop taking the antibiotic even if you start to feel better.  Keep all follow-up visits as told by your doctor. This is important. Contact a doctor if:  You have bleeding from your nose.  There is a lump on your neck.  You are not getting better in 5 days.  You feel worse instead of better. Get help right away if:  You have pain that is not helped with medicine.  You have swelling, redness, or pain around your ear.  You get a stiff neck.  You cannot move part of your face (paralyzed).  You notice that the bone behind your ear hurts when you touch it.  You get a very bad headache. Summary  Otitis media means that the middle ear is red, swollen, and full of fluid.  This condition usually goes away on its own. In some cases, treatment may be needed.  If you were prescribed an antibiotic medicine, take it as told by your doctor. This information is not intended to replace advice given to you by your health care provider. Make sure you discuss any questions you have with your health care provider. Document Revised: 03/07/2017 Document Reviewed: 04/15/2016 Elsevier Patient Education  2020 [employer]  

## 2020-02-29 NOTE — Assessment & Plan Note (Signed)
Treat with augmentin x10 days.  She had improvement of eustachian tube dysfunction with prednisone previously and will add additional course of this.  Diflucan as needed if she develops antibiotic associated yeast infection.  F/u in 3 weeks to be sure effusion is resolving.

## 2020-08-23 ENCOUNTER — Other Ambulatory Visit: Payer: Self-pay | Admitting: Family Medicine

## 2020-09-15 ENCOUNTER — Encounter: Payer: Self-pay | Admitting: Family Medicine

## 2020-09-15 ENCOUNTER — Telehealth (INDEPENDENT_AMBULATORY_CARE_PROVIDER_SITE_OTHER): Payer: 59 | Admitting: Family Medicine

## 2020-09-15 DIAGNOSIS — U071 COVID-19: Secondary | ICD-10-CM

## 2020-09-15 DIAGNOSIS — R110 Nausea: Secondary | ICD-10-CM | POA: Diagnosis not present

## 2020-09-15 MED ORDER — ONDANSETRON 8 MG PO TBDP: 8.0000 mg | ORAL_TABLET | Freq: Three times a day (TID) | ORAL | 3 refills | Status: DC | PRN

## 2020-09-15 NOTE — Patient Instructions (Signed)
Zofran sent in for nausea.   Hope you are feeling better soon! Let us know if you need anything.  Over the counter medications that may be helpful for symptoms:  Guaifenesin 1200 mg extended release tabs twice daily, with plenty of water For cough and congestion Brand name: Mucinex   Pseudoephedrine 30 mg, one or two tabs every 4 to 6 hours For sinus congestion Brand name: Sudafed You must get this from the pharmacy counter.  Oxymetazoline nasal spray each morning, one spray in each nostril, for NO MORE THAN 3 days  For nasal and sinus congestion Brand name: Afrin Saline nasal spray or Saline Nasal Irrigation 3-5 times a day For nasal and sinus congestion Brand names: Ocean or AYR Fluticasone nasal spray, one spray in each nostril, each morning (after oxymetazoline and saline, if used) For nasal and sinus congestion Brand name: Flonase Warm salt water gargles  For sore throat Every few hours as needed Alternate ibuprofen 400-600 mg and acetaminophen 1000 mg every 4-6 hours For fever, body aches, headache Brand names: Motrin or Advil and Tylenol Dextromethorphan 12-hour cough version 30 mg every 12 hours  For cough Brand name: Delsym Stop all other cold medications for now (Nyquil, Dayquil, Tylenol Cold, Theraflu, etc) and other non-prescription cough/cold preparations. Many of these have the same ingredients listed above and could cause an overdose of medication.   Herbal treatments that have been shown to be helpful in some patients include: Vitamin C 1000mg  per day Vitamin D 4000iU per day Zinc 100mg  per day Quercetin 25-500mg  twice a day Melatonin 5-10mg  at bedtime  General Instructions Allow your body to rest Drink PLENTY of fluids Isolate yourself from everyone, even family, until test results have returned  If your COVID-19 test is positive Then you ARE INFECTED and you can pass the virus to others You must quarantine from others for a minimum of  10 days since  symptoms started AND You are fever free for 24 hours WITHOUT any medication to reduce fever AND Your symptoms are improving Do not go to the store or other public areas Do not go around household members who are not known to be infected with COVID-19 If you MUST leave your area of quarantine (example: go to a bathroom you share with others in your home), you must Wear a mask Wash your hands thoroughly Wipe down any surfaces you touch Do not share food, drinks, towels, or other items with other persons Dispose of your own tissues, food containers, etc  Once you have recovered, please continue good preventive care measures, including:  wearing a mask when in public wash your hands frequently avoid touching your face/nose/eyes cover coughs/sneezes with the inside of your elbow stay out of crowds keep a 6 foot distance from others  If you develop severe shortness of breath, uncontrolled fevers, coughing up blood, confusion, chest pain, or signs of dehydration (such as significantly decreased urine amounts or dizziness with standing) please go to the ER.

## 2020-09-15 NOTE — Progress Notes (Signed)
Virtual Video Visit via MyChart Note  I connected with  Christy Burns on 09/15/20 at  2:20 PM EDT by the video enabled telemedicine application for MyChart, and verified that I am speaking with the correct person using two identifiers.   I introduced myself as a Publishing rights manager with the practice. We discussed the limitations of evaluation and management by telemedicine and the availability of in person appointments. The patient expressed understanding and agreed to proceed.  Participating parties in this visit include: The patient and the nurse practitioner listed.  The patient is: At home I am: In the office - [name]  Subjective:    CC: COVID +  HPI: Christy Burns is a 50 y.o. year old female presenting today via MyChart today for COVID infection.  She woke up feeling bad during the night on Tuesday and continued to worsen. Positive COVID on Thursday. She has fatigue, cough, body aches, but her worse symptom in nausea. She has only vomited once when she tried to eat soup yesterday, but otherwise just feels nauseous constantly. Because of this, she has not had much appetite, but reports she is staying hydrated. Denies dizziness, lightheadedness, fevers, chest pain, trouble breathing, diarrhea, loss of taste/smell.    Past medical history, Surgical history, Family history not pertinant except as noted below, Social history, Allergies, and medications have been entered into the medical record, reviewed, and corrections made.   Review of Systems:  All review of systems negative except what is listed in the HPI   Objective:    General:  Speaking clearly in complete sentences. Absent shortness of breath noted.   Alert and oriented x3.   Normal judgment.  Absent acute distress.   Impression and Recommendations:    1. COVID-19 2. Nausea  Doing well overall and not a candidate for antiviral therapy. Will try zofran for nausea and encourage adequate hydration.  Patient aware of signs/symptoms requiring further/urgent evaluation. Quarantine as recommended and continue OTC cough/cold/analgesic medications.   - ondansetron (ZOFRAN-ODT) 8 MG disintegrating tablet; Take 1 tablet (8 mg total) by mouth every 8 (eight) hours as needed for nausea.  Dispense: 20 tablet; Refill: 3   Follow-up if symptoms worsen or fail to improve.    I discussed the assessment and treatment plan with the patient. The patient was provided an opportunity to ask questions and all were answered. The patient agreed with the plan and demonstrated an understanding of the instructions.   The patient was advised to call back or seek an in-person evaluation if the symptoms worsen or if the condition fails to improve as anticipated.  I provided 20 minutes of non-face-to-face interaction with this MYCHART visit including intake, same-day documentation, and chart review.   Clayborne Dana, NP

## 2020-09-15 NOTE — Telephone Encounter (Signed)
Patient has been scheduled for a virtual visit with Christy Burns this afternoon. AM

## 2020-09-17 ENCOUNTER — Encounter: Payer: Self-pay | Admitting: Family Medicine

## 2020-09-18 ENCOUNTER — Telehealth: Payer: Self-pay

## 2020-09-18 NOTE — Telephone Encounter (Signed)
Transition Care Management Unsuccessful Follow-up Telephone Call  Date of discharge and from where:  09/16/2020 from [facility]  Attempts:  1st Attempt  Reason for unsuccessful TCM follow-up call:  Unable to reach patient

## 2020-09-19 NOTE — Telephone Encounter (Signed)
Transition Care Management Unsuccessful Follow-up Telephone Call  Date of discharge and from where:  09/16/20 from [facility]  Attempts:  2nd Attempt  Reason for unsuccessful TCM follow-up call:  Left voice message

## 2020-09-20 NOTE — Telephone Encounter (Signed)
Transition Care Management Unsuccessful Follow-up Telephone Call  Date of discharge and from where:  09/16/20 [facility]   Attempts:  3rd Attempt  Reason for unsuccessful TCM follow-up call:  Left voice message    

## 2020-12-06 ENCOUNTER — Encounter: Payer: Self-pay | Admitting: Family Medicine

## 2020-12-12 ENCOUNTER — Other Ambulatory Visit: Payer: Self-pay

## 2020-12-12 ENCOUNTER — Encounter: Payer: Self-pay | Admitting: Family Medicine

## 2020-12-12 ENCOUNTER — Ambulatory Visit (INDEPENDENT_AMBULATORY_CARE_PROVIDER_SITE_OTHER): Payer: 59

## 2020-12-12 ENCOUNTER — Ambulatory Visit (INDEPENDENT_AMBULATORY_CARE_PROVIDER_SITE_OTHER): Payer: 59 | Admitting: Family Medicine

## 2020-12-12 VITALS — BP 131/79 | HR 72 | Temp 97.6°F | Ht 67.0 in | Wt 177.8 lb

## 2020-12-12 DIAGNOSIS — Z23 Encounter for immunization: Secondary | ICD-10-CM

## 2020-12-12 DIAGNOSIS — M79671 Pain in right foot: Secondary | ICD-10-CM

## 2020-12-12 DIAGNOSIS — M79674 Pain in right toe(s): Secondary | ICD-10-CM

## 2020-12-12 DIAGNOSIS — M79676 Pain in unspecified toe(s): Secondary | ICD-10-CM | POA: Insufficient documentation

## 2020-12-12 MED ORDER — PREDNISONE 50 MG PO TABS: ORAL_TABLET | ORAL | 0 refills | Status: DC

## 2020-12-12 NOTE — Assessment & Plan Note (Signed)
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  No fractures and sesamoids noted.  She may have a component of sesamoiditis could be contributing.  Starting prednisone 50 mg daily x5 days.  If not improving with this we will have her follow-up.

## 2020-12-12 NOTE — Progress Notes (Signed)
Christy Burns - 50 y.o. female MRN [id number]  Date of birth: [birth year]  Subjective Chief Complaint  Patient presents with   Foot Pain    HPI Christy Burns is a 50 year old female here today with complaint of right foot pain.  She reports that this pain started a couple weeks ago.  There is pain with weightbearing as well as she puts pressure on the great toe.  She does not recall any injury or known overuse.  She has tried icing briefly without much improvement.  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  ROS:  A comprehensive ROS was completed and negative except as noted per HPI  No Known Allergies  Past Medical History:  Diagnosis Date   Osteopenia 02/05/2018   ASSESSMENT: The BMD measured at Femur Neck Left is 0.775 g/cm2 with a T-score of -1.9. This patient is considered osteopenic according to World Health Organization [facility]) criteria. The scan quality is good.  02/05/18    Thyroid disease     Past Surgical History:  Procedure Laterality Date   BREAST SURGERY     ELBOW FRACTURE SURGERY     WRIST SURGERY Right 2017    Social History   Socioeconomic History   Marital status: Married    Spouse name: Not on file   Number of children: Not on file   Years of education: Not on file   Highest education level: Not on file  Occupational History   Not on file  Tobacco Use   Smoking status: Never   Smokeless tobacco: Never  Vaping Use   Vaping Use: Never used  Substance and Sexual Activity   Alcohol use: No    Alcohol/week: 0.0 standard drinks   Drug use: Not on file   Sexual activity: Yes    Partners: Male  Other Topics Concern   Not on file  Social History Narrative   Not on file   Social Determinants of Health   Financial Resource Strain: Not on file  Food Insecurity: Not on file  Transportation Needs: Not on file  Physical Activity: Not on file  Stress: Not on file  Social Connections: Not on file    Family History  Problem  Relation Age of Onset   Depression Mother    Diabetes Father    Hyperlipidemia Father    Hypertension Father    Hearing loss Maternal Grandfather     Health Maintenance  Topic Date Due   COVID-19 Vaccine (1) Never done   Hepatitis C Screening  Never done   PAP SMEAR-Modifier  11/17/2017   MAMMOGRAM  Never done   Fecal DNA (Cologuard)  Never done   Zoster Vaccines- Shingrix (1 of 2) Never done   INFLUENZA VACCINE  11/06/2020   TETANUS/TDAP  01/30/2025   HIV Screening  Completed   Pneumococcal Vaccine 52-80 Years old  Aged Out   HPV VACCINES  Aged Out     ----------------------------------------------------------------------------------------------------------------------------------------------------------------------------------------------------------------- Physical Exam BP 131/79 (BP Location: Left Arm, Patient Position: Sitting, Cuff Size: Normal)   Pulse 72   Temp 97.6 F (36.4 C)   Ht 5\' 7"  (1.702 m)   Wt 177 lb 12.8 oz (80.6 kg)   SpO2 99%   BMI 27.85 kg/m   Physical Exam Constitutional:      Appearance: Normal appearance.  Musculoskeletal:     Comments: No swelling or erythema noted.  Range of motion of great toe is normal.  She does have some pain with flexion.  She has tenderness  along the plantar aspect of the MTP joint.  To me also has some tenderness along the sesamoids.  Neurological:     Mental Status: She is alert.    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  No fractures and sesamoids noted.  She may have a component of sesamoiditis could be contributing.  Starting prednisone 50 mg daily x5 days.  If not improving with this we will  have her follow-up.   Meds ordered this encounter  Medications   predniSONE (DELTASONE) 50 MG tablet    Sig: Take 1 tap po daily x5 days.    Dispense:  5 tablet    Refill:  0    No follow-ups on file.    This visit occurred during the SARS-CoV-2 public health emergency.  Safety protocols were in place, including screening questions prior to the visit, additional usage of staff PPE, and extensive cleaning of exam room while observing appropriate contact time as indicated for disinfecting solutions.

## 2020-12-19 ENCOUNTER — Other Ambulatory Visit: Payer: Self-pay | Admitting: Family Medicine

## 2020-12-19 MED ORDER — PREDNISONE 50 MG PO TABS: ORAL_TABLET | ORAL | 0 refills | Status: DC

## 2020-12-20 ENCOUNTER — Other Ambulatory Visit: Payer: Self-pay | Admitting: Family Medicine

## 2020-12-20 MED ORDER — MELOXICAM 7.5 MG PO TABS: 7.5000 mg | ORAL_TABLET | Freq: Every day | ORAL | 1 refills | Status: DC

## 2021-01-31 ENCOUNTER — Telehealth: Payer: Self-pay | Admitting: Family Medicine

## 2021-01-31 DIAGNOSIS — E559 Vitamin D deficiency, unspecified: Secondary | ICD-10-CM

## 2021-01-31 DIAGNOSIS — E039 Hypothyroidism, unspecified: Secondary | ICD-10-CM

## 2021-01-31 DIAGNOSIS — D709 Neutropenia, unspecified: Secondary | ICD-10-CM

## 2021-01-31 DIAGNOSIS — E785 Hyperlipidemia, unspecified: Secondary | ICD-10-CM

## 2021-01-31 DIAGNOSIS — Z0000 Encounter for general adult medical examination without abnormal findings: Secondary | ICD-10-CM

## 2021-01-31 NOTE — Telephone Encounter (Signed)
Pt has scheduled her physical for the end of next week and plans to come in on Monday to get her labs before, so can you place her lab orders for her physical and she will come in on Monday and have labs? Thanks

## 2021-02-05 ENCOUNTER — Encounter: Payer: 59 | Admitting: Family Medicine

## 2021-02-06 LAB — LIPID PANEL W/REFLEX DIRECT LDL
Cholesterol: 223 mg/dL — ABNORMAL HIGH (ref <200)
HDL: 77 mg/dL (ref >=50)
LDL Cholesterol (Calc): 124 mg/dL (calc) — ABNORMAL HIGH
Non-HDL Cholesterol (Calc): 146 mg/dL (calc) — ABNORMAL HIGH (ref <130)
Total CHOL/HDL Ratio: 2.9 (calc) (ref <5.0)
Triglycerides: 109 mg/dL (ref <150)

## 2021-02-06 LAB — CBC WITH DIFFERENTIAL/PLATELET
Absolute Monocytes: 347 cells/uL (ref 200–950)
Basophils Absolute: 31 cells/uL (ref 0–200)
Basophils Relative: 0.9 %
Eosinophils Absolute: 41 cells/uL (ref 15–500)
Eosinophils Relative: 1.2 %
HCT: 41.4 % (ref 35.0–45.0)
Hemoglobin: 14 g/dL (ref 11.7–15.5)
Lymphs Abs: 1176 cells/uL (ref 850–3900)
MCH: 30.7 pg (ref 27.0–33.0)
MCHC: 33.8 g/dL (ref 32.0–36.0)
MCV: 90.8 fL (ref 80.0–100.0)
MPV: 10.9 fL (ref 7.5–12.5)
Monocytes Relative: 10.2 %
Neutro Abs: 1805 cells/uL (ref 1500–7800)
Neutrophils Relative %: 53.1 %
Platelets: 223 10*3/uL (ref 140–400)
RBC: 4.56 10*6/uL (ref 3.80–5.10)
RDW: 12.2 % (ref 11.0–15.0)
Total Lymphocyte: 34.6 %
WBC: 3.4 10*3/uL — ABNORMAL LOW (ref 3.8–10.8)

## 2021-02-06 LAB — COMPREHENSIVE METABOLIC PANEL
AG Ratio: 2 (calc) (ref 1.0–2.5)
ALT: 15 U/L (ref 6–29)
AST: 11 U/L (ref 10–35)
Albumin: 4.7 g/dL (ref 3.6–5.1)
Alkaline phosphatase (APISO): 51 U/L (ref 37–153)
BUN: 12 mg/dL (ref 7–25)
CO2: 27 mmol/L (ref 20–32)
Calcium: 10 mg/dL (ref 8.6–10.4)
Chloride: 102 mmol/L (ref 98–110)
Creat: 0.71 mg/dL (ref 0.50–1.03)
Globulin: 2.3 g/dL (calc) (ref 1.9–3.7)
Glucose, Bld: 101 mg/dL — ABNORMAL HIGH (ref 65–99)
Potassium: 4.6 mmol/L (ref 3.5–5.3)
Sodium: 137 mmol/L (ref 135–146)
Total Bilirubin: 0.4 mg/dL (ref 0.2–1.2)
Total Protein: 7 g/dL (ref 6.1–8.1)

## 2021-02-06 LAB — TSH: TSH: 4.4 mIU/L

## 2021-02-06 LAB — VITAMIN D 25 HYDROXY (VIT D DEFICIENCY, FRACTURES): Vit D, 25-Hydroxy: 28 ng/mL — ABNORMAL LOW (ref 30–100)

## 2021-02-08 ENCOUNTER — Encounter: Payer: Self-pay | Admitting: Family Medicine

## 2021-02-08 ENCOUNTER — Other Ambulatory Visit: Payer: Self-pay

## 2021-02-08 ENCOUNTER — Ambulatory Visit (INDEPENDENT_AMBULATORY_CARE_PROVIDER_SITE_OTHER): Payer: 59 | Admitting: Family Medicine

## 2021-02-08 VITALS — BP 127/84 | HR 76 | Temp 98.7°F | Wt 177.0 lb

## 2021-02-08 DIAGNOSIS — Z0000 Encounter for general adult medical examination without abnormal findings: Secondary | ICD-10-CM | POA: Diagnosis not present

## 2021-02-08 DIAGNOSIS — Z1211 Encounter for screening for malignant neoplasm of colon: Secondary | ICD-10-CM

## 2021-02-08 NOTE — Progress Notes (Signed)
BP 127/84 (BP Location: Left Arm, Patient Position: Sitting, Cuff Size: Normal)   Pulse 76   Temp 98.7 F (37.1 C) (Oral)   Wt 177 lb 0.6 oz (80.3 kg)   SpO2 98%   BMI 27.73 kg/m    Subjective:    Patient ID: Christy Burns, female    DOB: 03-11-1971, 50 y.o.   MRN: [id number]  HPI: Christy Burns is a 50 y.o. female presenting on 02/08/2021 for comprehensive medical examination. Current medical complaints include: none  She currently lives with: husband, son (36) Interim Problems from her last visit: no   She reports regular vision exams q1-5y: yes She reports regular dental exams q 59m: yes Her diet consists of: no fat, carb; high protein, vegetables, fruits She endorses exercise and/or activity of: regular physical activity  She works at: desk job  She denies ETOH use. She denies nictoine use. She denies illegal substance use.    She reports post-menopausal age 33. Current menopausal symptoms: no She is currently  sexually active with husband She denies  concerns today about STI   She denies concerns about skin changes today. She denies concerns about bowel changes today. She denies concerns about bladder changes today.  Depression Screen done today and results listed below:  Depression screen [facility] 2/9 02/08/2021 02/01/2020 12/29/2017 12/24/2016  Decreased Interest 0 0 0 0  Down, Depressed, Hopeless 0 0 0 1  PHQ - 2 Score 0 0 0 1    She does not have a history of falls.     Past Medical History:  Past Medical History:  Diagnosis Date   Osteopenia 02/05/2018   ASSESSMENT: The BMD measured at Femur Neck Left is 0.775 g/cm2 with a T-score of -1.9. This patient is considered osteopenic according to World Health Organization [facility]) criteria. The scan quality is good.  02/05/18    Thyroid disease     Surgical History:  Past Surgical History:  Procedure Laterality Date   BREAST SURGERY     ELBOW FRACTURE SURGERY     WRIST SURGERY Right 2017    Medications:   Current Outpatient Medications on File Prior to Visit  Medication Sig   Cetirizine HCl 10 MG CAPS Take by mouth.   Cholecalciferol (VITAMIN D-3) 125 MCG (5000 UT) TABS Take by mouth.   fluticasone (FLONASE) 50 MCG/ACT nasal spray Place 2 sprays into both nostrils daily.   levothyroxine (SYNTHROID) 88 MCG tablet TAKE 1 TABLET BY MOUTH EVERY DAY   loratadine (CLARITIN) 10 MG tablet TAKE 1 TABLET (10 MG TOTAL) BY MOUTH DAILY.   meloxicam (MOBIC) 7.5 MG tablet Take 1-2 tablets (7.5-15 mg total) by mouth daily.   ondansetron (ZOFRAN-ODT) 8 MG disintegrating tablet Take 1 tablet (8 mg total) by mouth every 8 (eight) hours as needed for nausea.   predniSONE (DELTASONE) 50 MG tablet Take 1 tap po daily x5 days.   No current facility-administered medications on file prior to visit.    Allergies:  No Known Allergies  Social History:  Social History   Socioeconomic History   Marital status: Married    Spouse name: Not on file   Number of children: Not on file   Years of education: Not on file   Highest education level: Not on file  Occupational History   Not on file  Tobacco Use   Smoking status: Never   Smokeless tobacco: Never  Vaping Use   Vaping Use: Never used  Substance and Sexual Activity   Alcohol use: No  Alcohol/week: 0.0 standard drinks   Drug use: Not on file   Sexual activity: Yes    Partners: Male  Other Topics Concern   Not on file  Social History Narrative   Not on file   Social Determinants of Health   Financial Resource Strain: Not on file  Food Insecurity: Not on file  Transportation Needs: Not on file  Physical Activity: Not on file  Stress: Not on file  Social Connections: Not on file  Intimate Partner Violence: Not on file   Social History   Tobacco Use  Smoking Status Never  Smokeless Tobacco Never   Social History   Substance and Sexual Activity  Alcohol Use No   Alcohol/week: 0.0 standard drinks    Family History:  Family History   Problem Relation Age of Onset   Depression Mother    Diabetes Father    Hyperlipidemia Father    Hypertension Father    Hearing loss Maternal Grandfather     Past medical history, surgical history, medications, allergies, family history and social history reviewed with patient today and changes made to appropriate areas of the chart.   All ROS negative except what is listed above and in the HPI.      Objective:    BP 127/84 (BP Location: Left Arm, Patient Position: Sitting, Cuff Size: Normal)   Pulse 76   Temp 98.7 F (37.1 C) (Oral)   Wt 177 lb 0.6 oz (80.3 kg)   SpO2 98%   BMI 27.73 kg/m   Wt Readings from Last 3 Encounters:  02/08/21 177 lb 0.6 oz (80.3 kg)  12/12/20 177 lb 12.8 oz (80.6 kg)  02/29/20 182 lb 14.4 oz (83 kg)    Physical Exam Vitals reviewed.  Constitutional:      Appearance: Normal appearance.  HENT:     Head: Normocephalic and atraumatic.     Right Ear: Tympanic membrane normal.     Left Ear: Tympanic membrane normal.     Nose: Nose normal.     Mouth/Throat:     Mouth: Mucous membranes are dry.     Pharynx: Oropharynx is clear.  Eyes:     Extraocular Movements: Extraocular movements intact.     Conjunctiva/sclera: Conjunctivae normal.     Pupils: Pupils are equal, round, and reactive to light.  Cardiovascular:     Rate and Rhythm: Normal rate and regular rhythm.     Pulses: Normal pulses.     Heart sounds: Normal heart sounds.  Pulmonary:     Effort: Pulmonary effort is normal.     Breath sounds: Normal breath sounds.  Abdominal:     General: Abdomen is flat. Bowel sounds are normal.     Palpations: Abdomen is soft.     Tenderness: There is no right CVA tenderness or left CVA tenderness.  Musculoskeletal:        General: Normal range of motion.     Cervical back: Normal range of motion and neck supple.  Skin:    General: Skin is warm and dry.     Capillary Refill: Capillary refill takes less than 2 seconds.     Comments: Skin tag  between breasts, states it has not changed since she first noticed it about 15 years ago  Neurological:     General: No focal deficit present.     Mental Status: She is alert and oriented to person, place, and time. Mental status is at baseline.  Psychiatric:  Mood and Affect: Mood normal.        Behavior: Behavior normal.        Thought Content: Thought content normal.        Judgment: Judgment normal.     Results for orders placed or performed in visit on 01/31/21  CBC w/Diff/Platelet  Result Value Ref Range   WBC 3.4 (L) 3.8 - 10.8 Thousand/uL   RBC 4.56 3.80 - 5.10 Million/uL   Hemoglobin 14.0 11.7 - 15.5 g/dL   HCT 35.4 56.2 - 56.3 %   MCV 90.8 80.0 - 100.0 fL   MCH 30.7 27.0 - 33.0 pg   MCHC 33.8 32.0 - 36.0 g/dL   RDW 89.3 73.4 - 28.7 %   Platelets 223 140 - 400 Thousand/uL   MPV 10.9 7.5 - 12.5 fL   Neutro Abs 1,805 1,500 - 7,800 cells/uL   Lymphs Abs 1,176 850 - 3,900 cells/uL   Absolute Monocytes 347 200 - 950 cells/uL   Eosinophils Absolute 41 15 - 500 cells/uL   Basophils Absolute 31 0 - 200 cells/uL   Neutrophils Relative % 53.1 %   Total Lymphocyte 34.6 %   Monocytes Relative 10.2 %   Eosinophils Relative 1.2 %   Basophils Relative 0.9 %  Comprehensive metabolic panel  Result Value Ref Range   Glucose, Bld 101 (H) 65 - 99 mg/dL   BUN 12 7 - 25 mg/dL   Creat 6.81 1.57 - 2.62 mg/dL   BUN/Creatinine Ratio NOT APPLICABLE 6 - 22 (calc)   Sodium 137 135 - 146 mmol/L   Potassium 4.6 3.5 - 5.3 mmol/L   Chloride 102 98 - 110 mmol/L   CO2 27 20 - 32 mmol/L   Calcium 10.0 8.6 - 10.4 mg/dL   Total Protein 7.0 6.1 - 8.1 g/dL   Albumin 4.7 3.6 - 5.1 g/dL   Globulin 2.3 1.9 - 3.7 g/dL (calc)   AG Ratio 2.0 1.0 - 2.5 (calc)   Total Bilirubin 0.4 0.2 - 1.2 mg/dL   Alkaline phosphatase (APISO) 51 37 - 153 U/L   AST 11 10 - 35 U/L   ALT 15 6 - 29 U/L  TSH  Result Value Ref Range   TSH 4.40 mIU/L  VITAMIN D 25 Hydroxy (Vit-D Deficiency, Fractures)  Result  Value Ref Range   Vit D, 25-Hydroxy 28 (L) 30 - 100 ng/mL  Lipid Panel w/reflex Direct LDL  Result Value Ref Range   Cholesterol 223 (H) <200 mg/dL   HDL 77 > OR = 50 mg/dL   Triglycerides 035 <597 mg/dL   LDL Cholesterol (Calc) 124 (H) mg/dL (calc)   Total CHOL/HDL Ratio 2.9 <5.0 (calc)   Non-HDL Cholesterol (Calc) 146 (H) <130 mg/dL (calc)      Assessment & Plan:   Problem List Items Addressed This Visit   None Visit Diagnoses     Annual physical exam    -  Primary   Relevant Orders   Cologuard   Colon cancer screening       Relevant Orders   Cologuard          LABORATORY TESTING:  - Health maintenance labs ordered today as discussed above.           -all done last week, reviewed with patient  - STI testing: deferred  - Pap smear: deferred    IMMUNIZATIONS:   - Tdap: Tetanus vaccination status reviewed: last tetanus booster within 10 years. - Influenza:  plans to get at [employer] -  Pneumovax: Not applicable - Prevnar: Not applicable - HPV: Not applicable - Shingrix vaccine: Not applicable - COVID-19: Refused  SCREENING: - Mammogram: declined - Bone Density: Not applicable - Colonoscopy: Ordered today  Discussed with patient purpose of the colonoscopy is to detect colon cancer at curable precancerous or early stages  - AAA Screening: Not applicable  -Hearing Test: Not applicable  -Spirometry: Not applicable  - Lung Cancer Screening: Not applicable    PATIENT COUNSELING:   Advised to take 1 mg of folate supplement per day if capable of pregnancy.   Sexuality: Discussed sexually transmitted diseases, partner selection, use of condoms, avoidance of unintended pregnancy, and contraceptive alternatives.    I discussed with the patient that most people either abstain from alcohol or drink within safe limits (<=14/week and <=4 drinks/occasion for males, <=7/weeks and <= 3 drinks/occasion for females) and that the risk for alcohol disorders and other health  effects rises proportionally with the number of drinks per week and how often a drinker exceeds daily limits.  Discussed cessation/primary prevention of drug use and availability of treatment for abuse.   Diet: Encouraged to adjust caloric intake to maintain or achieve ideal body weight, to reduce intake of dietary saturated fat and total fat, to limit sodium intake by avoiding high sodium foods and not adding table salt, and to maintain adequate dietary potassium and calcium preferably from fresh fruits, vegetables, and low-fat dairy products. Encouraged vitamin D 1000 units and Calcium 1300mg  or 4 servings of dairy a day.  Emphasized the importance of regular exercise.  Injury prevention: Discussed safety belts, safety helmets, smoke detector, smoking near bedding or upholstery.   Dental health: Discussed importance of regular tooth brushing, flossing, and dental visits.  Follow up plan:  Return in about 1 year (around 02/08/2022).   Lollie Marrow Reola Calkins, DNP, FNP-C

## 2021-03-16 ENCOUNTER — Other Ambulatory Visit: Payer: Self-pay | Admitting: Family Medicine

## 2021-03-28 ENCOUNTER — Telehealth: Payer: Self-pay | Admitting: General Practice

## 2021-03-28 NOTE — Telephone Encounter (Signed)
Transition Care Management Unsuccessful Follow-up Telephone Call  Date of discharge and from where:  03/27/21 from [facility]  Attempts:  1st Attempt  Reason for unsuccessful TCM follow-up call:  Left voice message

## 2021-03-30 NOTE — Telephone Encounter (Signed)
Transition Care Management Unsuccessful Follow-up Telephone Call  Date of discharge and from where:  03/27/21 from [facility]  Attempts:  2nd Attempt  Reason for unsuccessful TCM follow-up call:  Left voice message

## 2021-04-04 NOTE — Telephone Encounter (Signed)
Transition Care Management Unsuccessful Follow-up Telephone Call  Date of discharge and from where:  03/27/21 from [facility]  Attempts:  3rd Attempt  Reason for unsuccessful TCM follow-up call:  Left voice message

## 2021-04-19 ENCOUNTER — Other Ambulatory Visit: Payer: Self-pay

## 2021-04-19 ENCOUNTER — Encounter: Payer: Self-pay | Admitting: Family Medicine

## 2021-04-19 ENCOUNTER — Ambulatory Visit (INDEPENDENT_AMBULATORY_CARE_PROVIDER_SITE_OTHER): Payer: 59 | Admitting: Family Medicine

## 2021-04-19 DIAGNOSIS — H6690 Otitis media, unspecified, unspecified ear: Secondary | ICD-10-CM | POA: Diagnosis not present

## 2021-04-19 MED ORDER — OFLOXACIN 0.3 % OT SOLN: 10.0000 [drp] | Freq: Two times a day (BID) | OTIC | 0 refills | Status: DC

## 2021-04-22 DIAGNOSIS — H6690 Otitis media, unspecified, unspecified ear: Secondary | ICD-10-CM | POA: Insufficient documentation

## 2021-04-22 NOTE — Assessment & Plan Note (Signed)
Starting course of ofloxacin drops.  Instructed to contact clinic if symptoms worsen including development of worsening pain, drainage from the ear, headache or fever.

## 2021-04-22 NOTE — Progress Notes (Signed)
Christy Burns - 51 y.o. female MRN [id number]  Date of birth: January 23, 1971  Subjective Chief Complaint  Patient presents with   Ear Fullness    HPI Christy Burns is a 51 year old female here today with complaint of right ear fullness.  She has a history of recurrent suppurative otitis media and had tympanostomy tubes placed last year.  She denies any drainage from the ear, fever, chills or headaches.  ROS:  A comprehensive ROS was completed and negative except as noted per HPI  Allergies  Allergen Reactions   Hydrocodone Nausea And Vomiting    Has taken Oxycodone and done well     Past Medical History:  Diagnosis Date   Osteopenia 02/05/2018   ASSESSMENT: The BMD measured at Femur Neck Left is 0.775 g/cm2 with a T-score of -1.9. This patient is considered osteopenic according to World Health Organization [facility]) criteria. The scan quality is good.  02/05/18    Thyroid disease     Past Surgical History:  Procedure Laterality Date   BREAST SURGERY     ELBOW FRACTURE SURGERY     WRIST SURGERY Right 2017    Social History   Socioeconomic History   Marital status: Married    Spouse name: Not on file   Number of children: Not on file   Years of education: Not on file   Highest education level: Not on file  Occupational History   Not on file  Tobacco Use   Smoking status: Never   Smokeless tobacco: Never  Vaping Use   Vaping Use: Never used  Substance and Sexual Activity   Alcohol use: No    Alcohol/week: 0.0 standard drinks   Drug use: Not on file   Sexual activity: Yes    Partners: Male  Other Topics Concern   Not on file  Social History Narrative   Not on file   Social Determinants of Health   Financial Resource Strain: Not on file  Food Insecurity: Not on file  Transportation Needs: Not on file  Physical Activity: Not on file  Stress: Not on file  Social Connections: Not on file    Family History  Problem Relation Age of Onset   Depression Mother     Diabetes Father    Hyperlipidemia Father    Hypertension Father    Hearing loss Maternal Grandfather     Health Maintenance  Topic Date Due   COVID-19 Vaccine (1) Never done   Hepatitis C Screening  Never done   PAP SMEAR-Modifier  11/17/2017   Fecal DNA (Cologuard)  Never done   Zoster Vaccines- Shingrix (1 of 2) 05/11/2021 (Originally 05/19/2018)   INFLUENZA VACCINE  07/06/2021 (Originally 11/06/2020)   MAMMOGRAM  02/08/2022 (Originally 05/19/2018)   TETANUS/TDAP  01/30/2025   HIV Screening  Completed   Pneumococcal Vaccine 83-39 Years old  Aged Out   HPV VACCINES  Aged Out     ----------------------------------------------------------------------------------------------------------------------------------------------------------------------------------------------------------------- Physical Exam BP 125/82    Pulse 77    Ht 5\' 7"  (1.702 m)    Wt 177 lb (80.3 kg)    SpO2 95%    BMI 27.72 kg/m   Physical Exam Constitutional:      Appearance: Normal appearance.  HENT:     Left Ear: Tympanic membrane and ear canal normal.     Ears:     Comments: There is erythema of the right eardrum.  No fluid noted.  Tympanostomy tube appears patent. Cardiovascular:     Rate and Rhythm: Normal rate and regular  rhythm.  Neurological:     Mental Status: She is alert.    ------------------------------------------------------------------------------------------------------------------------------------------------------------------------------------------------------------------- Assessment and Plan  Otitis media Starting course of ofloxacin drops.  Instructed to contact clinic if symptoms worsen including development of worsening pain, drainage from the ear, headache or fever.   Meds ordered this encounter  Medications   ofloxacin (FLOXIN) 0.3 % OTIC solution    Sig: Place 10 drops into the right ear 2 (two) times daily.    Dispense:  5 mL    Refill:  0    No follow-ups on  file.    This visit occurred during the SARS-CoV-2 public health emergency.  Safety protocols were in place, including screening questions prior to the visit, additional usage of staff PPE, and extensive cleaning of exam room while observing appropriate contact time as indicated for disinfecting solutions.

## 2021-12-27 NOTE — Progress Notes (Signed)
Acute Office Visit  Subjective:     Patient ID: Christy Burns, female    DOB: [birth year], 51 y.o.   MRN: [id number]  Chief Complaint  Patient presents with   Otalgia   Facial Pain    HPI Patient is in today for sick visit. She has been having congestion and cough. Symptoms started more than 8 days ago. She has been having headaches and head pressure. She is having right ear otalgia.   Review of Systems  Constitutional:  Negative for chills and fever.  HENT:  Positive for ear pain and sinus pain.   Respiratory:  Negative for cough and shortness of breath.   Cardiovascular:  Negative for chest pain.  Neurological:  Negative for headaches.        Objective:    BP 119/84   Pulse 71   Ht 5\' 7"  (1.702 m)   Wt 175 lb (79.4 kg)   SpO2 96%   BMI 27.41 kg/m    Physical Exam Vitals and nursing note reviewed.  Constitutional:      General: She is not in acute distress.    Appearance: Normal appearance.  HENT:     Head: Normocephalic and atraumatic.     Comments: Tenderness to palpation of maxillary and frontal sinus bilaterally.    Right Ear: External ear normal.     Left Ear: Tympanic membrane, ear canal and external ear normal.     Ears:     Comments: R ear bulging. Tube present and intact.    Nose: Nose normal.  Eyes:     Conjunctiva/sclera: Conjunctivae normal.  Cardiovascular:     Rate and Rhythm: Normal rate and regular rhythm.  Pulmonary:     Effort: Pulmonary effort is normal.     Breath sounds: Normal breath sounds.  Musculoskeletal:     Cervical back: No tenderness.  Lymphadenopathy:     Cervical: No cervical adenopathy.  Neurological:     General: No focal deficit present.     Mental Status: She is alert and oriented to person, place, and time.  Psychiatric:        Mood and Affect: Mood normal.        Behavior: Behavior normal.        Thought Content: Thought content normal.        Judgment: Judgment normal.     No results found for any visits  on 12/28/21.      Assessment & Plan:   Problem List Items Addressed This Visit       Respiratory   Acute bacterial rhinosinusitis - Primary    - pt has tenderness to palpation of sinuses that has been going on for greater than 8 days with no improvement - will go ahead and treat with augmentin  - gave RTC precautions      Relevant Medications   amoxicillin-clavulanate (AUGMENTIN) 875-125 MG tablet   predniSONE (DELTASONE) 20 MG tablet     Nervous and Auditory   Right acute suppurative otitis media    - pt has a hx of eustachian tube dysfunction and had bulging on ear today. Tube seen intact in right ear - since weekend is coming up will go ahead and treat with ofloxacin ear drops and prednisone for a few days to help decrease ear inflammation and pressure. augmentin will also help      Relevant Medications   amoxicillin-clavulanate (AUGMENTIN) 875-125 MG tablet    Meds ordered this encounter  Medications   amoxicillin-clavulanate (AUGMENTIN)  875-125 MG tablet    Sig: Take 1 tablet by mouth 2 (two) times daily.    Dispense:  20 tablet    Refill:  0   ofloxacin (FLOXIN) 0.3 % OTIC solution    Sig: Place 10 drops into the right ear 2 (two) times daily.    Dispense:  5 mL    Refill:  0   predniSONE (DELTASONE) 20 MG tablet    Sig: Take 1 tablet (20 mg total) by mouth daily with breakfast for 4 days.    Dispense:  4 tablet    Refill:  0    Return if symptoms worsen or fail to improve.  Owens Loffler, DO

## 2021-12-28 ENCOUNTER — Encounter: Payer: Self-pay | Admitting: Family Medicine

## 2021-12-28 ENCOUNTER — Ambulatory Visit (INDEPENDENT_AMBULATORY_CARE_PROVIDER_SITE_OTHER): Payer: 59 | Admitting: Family Medicine

## 2021-12-28 VITALS — BP 119/84 | HR 71 | Ht 67.0 in | Wt 175.0 lb

## 2021-12-28 DIAGNOSIS — B9689 Other specified bacterial agents as the cause of diseases classified elsewhere: Secondary | ICD-10-CM | POA: Diagnosis not present

## 2021-12-28 DIAGNOSIS — J019 Acute sinusitis, unspecified: Secondary | ICD-10-CM

## 2021-12-28 DIAGNOSIS — H66001 Acute suppurative otitis media without spontaneous rupture of ear drum, right ear: Secondary | ICD-10-CM | POA: Diagnosis not present

## 2021-12-28 MED ORDER — AMOXICILLIN-POT CLAVULANATE 875-125 MG PO TABS: 1.0000 | ORAL_TABLET | Freq: Two times a day (BID) | ORAL | 0 refills | Status: DC

## 2021-12-28 MED ORDER — PREDNISONE 20 MG PO TABS: 20.0000 mg | ORAL_TABLET | Freq: Every day | ORAL | 0 refills | Status: AC

## 2021-12-28 MED ORDER — OFLOXACIN 0.3 % OT SOLN: 10.0000 [drp] | Freq: Two times a day (BID) | OTIC | 0 refills | Status: DC

## 2021-12-28 NOTE — Assessment & Plan Note (Signed)
-   pt has a hx of eustachian tube dysfunction and had bulging on ear today. Tube seen intact in right ear - since weekend is coming up will go ahead and treat with ofloxacin ear drops and prednisone for a few days to help decrease ear inflammation and pressure. augmentin will also help

## 2021-12-28 NOTE — Assessment & Plan Note (Signed)
-   pt has tenderness to palpation of sinuses that has been going on for greater than 8 days with no improvement - will go ahead and treat with augmentin  - gave RTC precautions

## 2022-02-06 ENCOUNTER — Encounter: Payer: Self-pay | Admitting: Family Medicine

## 2022-02-06 DIAGNOSIS — Z0000 Encounter for general adult medical examination without abnormal findings: Secondary | ICD-10-CM

## 2022-02-06 DIAGNOSIS — E559 Vitamin D deficiency, unspecified: Secondary | ICD-10-CM

## 2022-02-06 DIAGNOSIS — E785 Hyperlipidemia, unspecified: Secondary | ICD-10-CM

## 2022-02-06 DIAGNOSIS — E039 Hypothyroidism, unspecified: Secondary | ICD-10-CM

## 2022-02-06 NOTE — Telephone Encounter (Signed)
Orders signed.

## 2022-02-09 LAB — CBC WITH DIFFERENTIAL/PLATELET
Absolute Monocytes: 373 cells/uL (ref 200–950)
Basophils Absolute: 29 cells/uL (ref 0–200)
Basophils Relative: 0.7 %
Eosinophils Absolute: 41 cells/uL (ref 15–500)
Eosinophils Relative: 1 %
HCT: 40.1 % (ref 35.0–45.0)
Hemoglobin: 13.9 g/dL (ref 11.7–15.5)
Lymphs Abs: 1316 cells/uL (ref 850–3900)
MCH: 31 pg (ref 27.0–33.0)
MCHC: 34.7 g/dL (ref 32.0–36.0)
MCV: 89.3 fL (ref 80.0–100.0)
MPV: 10.6 fL (ref 7.5–12.5)
Monocytes Relative: 9.1 %
Neutro Abs: 2341 cells/uL (ref 1500–7800)
Neutrophils Relative %: 57.1 %
Platelets: 234 10*3/uL (ref 140–400)
RBC: 4.49 10*6/uL (ref 3.80–5.10)
RDW: 12.1 % (ref 11.0–15.0)
Total Lymphocyte: 32.1 %
WBC: 4.1 10*3/uL (ref 3.8–10.8)

## 2022-02-09 LAB — LIPID PANEL W/REFLEX DIRECT LDL
Cholesterol: 229 mg/dL — ABNORMAL HIGH (ref <200)
HDL: 72 mg/dL (ref >=50)
LDL Cholesterol (Calc): 135 mg/dL (calc) — ABNORMAL HIGH
Non-HDL Cholesterol (Calc): 157 mg/dL (calc) — ABNORMAL HIGH (ref <130)
Total CHOL/HDL Ratio: 3.2 (calc) (ref <5.0)
Triglycerides: 116 mg/dL (ref <150)

## 2022-02-09 LAB — COMPLETE METABOLIC PANEL WITH GFR
AG Ratio: 2 (calc) (ref 1.0–2.5)
ALT: 20 U/L (ref 6–29)
AST: 11 U/L (ref 10–35)
Albumin: 4.7 g/dL (ref 3.6–5.1)
Alkaline phosphatase (APISO): 64 U/L (ref 37–153)
BUN: 13 mg/dL (ref 7–25)
CO2: 26 mmol/L (ref 20–32)
Calcium: 9.8 mg/dL (ref 8.6–10.4)
Chloride: 103 mmol/L (ref 98–110)
Creat: 0.64 mg/dL (ref 0.50–1.03)
Globulin: 2.3 g/dL (calc) (ref 1.9–3.7)
Glucose, Bld: 96 mg/dL (ref 65–99)
Potassium: 4.7 mmol/L (ref 3.5–5.3)
Sodium: 138 mmol/L (ref 135–146)
Total Bilirubin: 0.6 mg/dL (ref 0.2–1.2)
Total Protein: 7 g/dL (ref 6.1–8.1)
eGFR: 106 mL/min/{1.73_m2} (ref >=60)

## 2022-02-09 LAB — VITAMIN D 25 HYDROXY (VIT D DEFICIENCY, FRACTURES): Vit D, 25-Hydroxy: 19 ng/mL — ABNORMAL LOW (ref 30–100)

## 2022-02-09 LAB — TSH: TSH: 2.72 mIU/L

## 2022-02-11 ENCOUNTER — Encounter: Payer: 59 | Admitting: Family Medicine

## 2022-02-14 ENCOUNTER — Encounter: Payer: Self-pay | Admitting: Family Medicine

## 2022-02-14 ENCOUNTER — Ambulatory Visit (INDEPENDENT_AMBULATORY_CARE_PROVIDER_SITE_OTHER): Payer: 59 | Admitting: Family Medicine

## 2022-02-14 VITALS — BP 127/82 | HR 109 | Ht 67.0 in | Wt 180.0 lb

## 2022-02-14 DIAGNOSIS — E663 Overweight: Secondary | ICD-10-CM | POA: Diagnosis not present

## 2022-02-14 DIAGNOSIS — Z1211 Encounter for screening for malignant neoplasm of colon: Secondary | ICD-10-CM | POA: Diagnosis not present

## 2022-02-14 DIAGNOSIS — Z0000 Encounter for general adult medical examination without abnormal findings: Secondary | ICD-10-CM

## 2022-02-14 MED ORDER — LEVOTHYROXINE SODIUM 88 MCG PO TABS: 88.0000 ug | ORAL_TABLET | Freq: Every day | ORAL | 2 refills | Status: DC

## 2022-02-14 NOTE — Assessment & Plan Note (Addendum)
Well adult We reviewed her labs today that she had completed prior to the visit.  Cholesterol noted to be elevated.  She is interested in seeing a dietitian to review her diet and see if there is any changes she can work on to help manage her weight loss she is unable to exercise regularly. Screenings: Cologuard ordered. Immunizations: Up-to-date Anticipatory guidance/risk factor reduction: Recommendations per AVS.

## 2022-02-14 NOTE — Progress Notes (Signed)
Christy Burns - 51 y.o. female MRN [id number]  Date of birth: [birth year]  Subjective Chief Complaint  Patient presents with   Annual Exam     HPI Christy Burns is a 51 year old female here today for annual exam.  She had a fall from a horse that resulted in a foot injury.  Had foot surgery however has continued to have pain.  She is now seeing another physician for treatment of this.  Is eventually going back to have surgery again.  She is trying to stay as active as she can.  She does enjoy exercise and misses being able to fully work out due to her foot.  She does try to do exercises nonweightbearing on that foot.  She does follow a healthy diet.  She has regular dental care.  She is a non-smoker.  Denies alcohol intake.  Immunizations are up-to-date.  Pap and mammogram are up-to-date.  She would like Cologuard for colon cancer screening.  No significant family history of cancer.  Review of Systems  Constitutional:  Negative for chills, fever, malaise/fatigue and weight loss.  HENT:  Negative for congestion, ear pain and sore throat.   Eyes:  Negative for blurred vision, double vision and pain.  Respiratory:  Negative for cough and shortness of breath.   Cardiovascular:  Negative for chest pain and palpitations.  Gastrointestinal:  Negative for abdominal pain, blood in stool, constipation, heartburn and nausea.  Genitourinary:  Negative for dysuria and urgency.  Musculoskeletal:  Negative for joint pain and myalgias.  Neurological:  Negative for dizziness and headaches.  Endo/Heme/Allergies:  Does not bruise/bleed easily.  Psychiatric/Behavioral:  Negative for depression. The patient is not nervous/anxious and does not have insomnia.     Allergies  Allergen Reactions   Hydrocodone Nausea And Vomiting    Has taken Oxycodone and done well     Past Medical History:  Diagnosis Date   Osteopenia 02/05/2018   ASSESSMENT: The BMD measured at Femur Neck Left is 0.775 g/cm2 with  a T-score of -1.9. This patient is considered osteopenic according to World Health Organization [facility]) criteria. The scan quality is good.  02/05/18    Thyroid disease     Past Surgical History:  Procedure Laterality Date   BREAST SURGERY     ELBOW FRACTURE SURGERY     WRIST SURGERY Right 2017    Social History   Socioeconomic History   Marital status: Married    Spouse name: Not on file   Number of children: Not on file   Years of education: Not on file   Highest education level: Not on file  Occupational History   Not on file  Tobacco Use   Smoking status: Never   Smokeless tobacco: Never  Vaping Use   Vaping Use: Never used  Substance and Sexual Activity   Alcohol use: No    Alcohol/week: 0.0 standard drinks of alcohol   Drug use: Not on file   Sexual activity: Yes    Partners: Male  Other Topics Concern   Not on file  Social History Narrative   Not on file   Social Determinants of Health   Financial Resource Strain: Not on file  Food Insecurity: Not on file  Transportation Needs: Not on file  Physical Activity: Sufficiently Active (12/29/2017)   Exercise Vital Sign    Days of Exercise per Week: 7 days    Minutes of Exercise per Session: 40 min  Stress: Not on file  Social Connections: Not on file  Family History  Problem Relation Age of Onset   Depression Mother    Diabetes Father    Hyperlipidemia Father    Hypertension Father    Hearing loss Maternal Grandfather     Health Maintenance  Topic Date Due   PAP SMEAR-Modifier  05/09/2022 (Originally 11/17/2017)   COVID-19 Vaccine (1) 05/09/2022 (Originally 11/16/1970)   Zoster Vaccines- Shingrix (1 of 2) 05/17/2022 (Originally 05/19/2018)   INFLUENZA VACCINE  07/07/2022 (Originally 11/06/2021)   MAMMOGRAM  02/15/2023 (Originally 05/19/2018)   Hepatitis C Screening  02/15/2023 (Originally 05/19/1986)   Fecal DNA (Cologuard)  02/15/2023 (Originally 05/19/2013)   TETANUS/TDAP  01/30/2025   HIV Screening   Completed   HPV VACCINES  Aged Out     ----------------------------------------------------------------------------------------------------------------------------------------------------------------------------------------------------------------- Physical Exam BP 127/82 (BP Location: Left Arm, Patient Position: Sitting, Cuff Size: Normal)   Pulse (!) 109   Ht 5\' 7"  (1.702 m)   Wt 180 lb (81.6 kg)   SpO2 97%   BMI 28.19 kg/m   Physical Exam Constitutional:      General: She is not in acute distress. HENT:     Head: Normocephalic and atraumatic.     Right Ear: Tympanic membrane and ear canal normal.     Left Ear: Tympanic membrane and ear canal normal.     Nose: Nose normal.  Eyes:     General: No scleral icterus.    Conjunctiva/sclera: Conjunctivae normal.  Neck:     Thyroid: No thyromegaly.  Cardiovascular:     Rate and Rhythm: Normal rate and regular rhythm.     Heart sounds: Normal heart sounds.  Pulmonary:     Effort: Pulmonary effort is normal.     Breath sounds: Normal breath sounds.  Abdominal:     General: Bowel sounds are normal. There is no distension.     Palpations: Abdomen is soft.     Tenderness: There is no abdominal tenderness. There is no guarding.  Musculoskeletal:        General: Normal range of motion.     Cervical back: Normal range of motion and neck supple.  Lymphadenopathy:     Cervical: No cervical adenopathy.  Skin:    General: Skin is warm and dry.     Findings: No rash.  Neurological:     General: No focal deficit present.     Mental Status: She is alert and oriented to person, place, and time.     Cranial Nerves: No cranial nerve deficit.     Coordination: Coordination normal.  Psychiatric:        Mood and Affect: Mood normal.        Behavior: Behavior normal.      ------------------------------------------------------------------------------------------------------------------------------------------------------------------------------------------------------------------- Assessment and Plan  Well adult exam Well adult We reviewed her labs today that she had completed prior to the visit.  Cholesterol noted to be elevated.  She is interested in seeing a dietitian to review her diet and see if there is any changes she can work on to help manage her weight loss she is unable to exercise regularly. Screenings: Cologuard ordered. Immunizations: Up-to-date Anticipatory guidance/risk factor reduction: Recommendations per AVS.   Meds ordered this encounter  Medications   levothyroxine (SYNTHROID) 88 MCG tablet    Sig: Take 1 tablet (88 mcg total) by mouth daily.    Dispense:  90 tablet    Refill:  2    No follow-ups on file.    This visit occurred during the SARS-CoV-2 public health emergency.  Safety protocols were  in place, including screening questions prior to the visit, additional usage of staff PPE, and extensive cleaning of exam room while observing appropriate contact time as indicated for disinfecting solutions.

## 2022-02-14 NOTE — Patient Instructions (Signed)
Preventive Care 53-53 Years Old, Female Preventive care refers to lifestyle choices and visits with your health care provider that can promote health and wellness. Preventive care visits are also called wellness exams. What can I expect for my preventive care visit? Counseling Your health care provider may ask you questions about your: Medical history, including: Past medical problems. Family medical history. Pregnancy history. Current health, including: Menstrual cycle. Method of birth control. Emotional well-being. Home life and relationship well-being. Sexual activity and sexual health. Lifestyle, including: Alcohol, nicotine or tobacco, and drug use. Access to firearms. Diet, exercise, and sleep habits. Work and work environment. Sunscreen use. Safety issues such as seatbelt and bike helmet use. Physical exam Your health care provider will check your: Height and weight. These may be used to calculate your BMI (body mass index). BMI is a measurement that tells if you are at a healthy weight. Waist circumference. This measures the distance around your waistline. This measurement also tells if you are at a healthy weight and may help predict your risk of certain diseases, such as type 2 diabetes and high blood pressure. Heart rate and blood pressure. Body temperature. Skin for abnormal spots. What immunizations do I need?  Vaccines are usually given at various ages, according to a schedule. Your health care provider will recommend vaccines for you based on your age, medical history, and lifestyle or other factors, such as travel or where you work. What tests do I need? Screening Your health care provider may recommend screening tests for certain conditions. This may include: Lipid and cholesterol levels. Diabetes screening. This is done by checking your blood sugar (glucose) after you have not eaten for a while (fasting). Pelvic exam and Pap test. Hepatitis B test. Hepatitis C  test. HIV (human immunodeficiency virus) test. STI (sexually transmitted infection) testing, if you are at risk. Lung cancer screening. Colorectal cancer screening. Mammogram. Talk with your health care provider about when you should start having regular mammograms. This may depend on whether you have a family history of breast cancer. BRCA-related cancer screening. This may be done if you have a family history of breast, ovarian, tubal, or peritoneal cancers. Bone density scan. This is done to screen for osteoporosis. Talk with your health care provider about your test results, treatment options, and if necessary, the need for more tests. Follow these instructions at home: Eating and drinking  Eat a diet that includes fresh fruits and vegetables, whole grains, lean protein, and low-fat dairy products. Take vitamin and mineral supplements as recommended by your health care provider. Do not drink alcohol if: Your health care provider tells you not to drink. You are pregnant, may be pregnant, or are planning to become pregnant. If you drink alcohol: Limit how much you have to 0-1 drink a day. Know how much alcohol is in your drink. In the [place] one drink equals one 12 oz bottle of beer (355 mL), one 5 oz glass of wine (148 mL), or one 1 oz glass of hard liquor (44 mL). Lifestyle Brush your teeth every morning and night with fluoride toothpaste. Floss one time each day. Exercise for at least 30 minutes 5 or more days each week. Do not use any products that contain nicotine or tobacco. These products include cigarettes, chewing tobacco, and vaping devices, such as e-cigarettes. If you need help quitting, ask your health care provider. Do not use drugs. If you are sexually active, practice safe sex. Use a condom or other form of protection to   prevent STIs. If you do not wish to become pregnant, use a form of birth control. If you plan to become pregnant, see your health care provider for a  prepregnancy visit. Take aspirin only as told by your health care provider. Make sure that you understand how much to take and what form to take. Work with your health care provider to find out whether it is safe and beneficial for you to take aspirin daily. Find healthy ways to manage stress, such as: Meditation, yoga, or listening to music. Journaling. Talking to a trusted person. Spending time with friends and family. Minimize exposure to UV radiation to reduce your risk of skin cancer. Safety Always wear your seat belt while driving or riding in a vehicle. Do not drive: If you have been drinking alcohol. Do not ride with someone who has been drinking. When you are tired or distracted. While texting. If you have been using any mind-altering substances or drugs. Wear a helmet and other protective equipment during sports activities. If you have firearms in your house, make sure you follow all gun safety procedures. Seek help if you have been physically or sexually abused. What's next? Visit your health care provider once a year for an annual wellness visit. Ask your health care provider how often you should have your eyes and teeth checked. Stay up to date on all vaccines. This information is not intended to replace advice given to you by your health care provider. Make sure you discuss any questions you have with your health care provider. Document Revised: 09/20/2020 Document Reviewed: 09/20/2020 Elsevier Patient Education  [place].

## 2022-05-08 ENCOUNTER — Encounter: Payer: Self-pay | Admitting: Family Medicine

## 2022-05-08 ENCOUNTER — Ambulatory Visit (INDEPENDENT_AMBULATORY_CARE_PROVIDER_SITE_OTHER): Payer: Managed Care, Other (non HMO) | Admitting: Family Medicine

## 2022-05-08 VITALS — BP 110/75 | HR 82 | Ht 67.0 in | Wt 180.0 lb

## 2022-05-08 DIAGNOSIS — J329 Chronic sinusitis, unspecified: Secondary | ICD-10-CM | POA: Insufficient documentation

## 2022-05-08 DIAGNOSIS — J40 Bronchitis, not specified as acute or chronic: Secondary | ICD-10-CM

## 2022-05-08 MED ORDER — DOXYCYCLINE HYCLATE 100 MG PO TABS: 100.0000 mg | ORAL_TABLET | Freq: Two times a day (BID) | ORAL | 0 refills | Status: DC

## 2022-05-08 MED ORDER — ALBUTEROL SULFATE HFA 108 (90 BASE) MCG/ACT IN AERS: 2.0000 | INHALATION_SPRAY | Freq: Four times a day (QID) | RESPIRATORY_TRACT | 0 refills | Status: DC | PRN

## 2022-05-08 MED ORDER — PREDNISONE 50 MG PO TABS: ORAL_TABLET | ORAL | 0 refills | Status: DC

## 2022-05-08 NOTE — Progress Notes (Signed)
Christy Burns - 52 y.o. female MRN [id number]  Date of birth: 1970-05-24  Subjective Chief Complaint  Patient presents with   Cough    HPI Christy Burns is a 52 y.o. female here today with complaint of cough and congestion.  She has had dry cough for about 10 days.  Starting to have more ear pressure as well.  Mild dyspnea with activity  Has had some wheezing as well.  Denies  fever, chills, nausea or vomiting, headache or body aches. She has tried mucinex and cetirizine  ROS:  A comprehensive ROS was completed and negative except as noted per HPI  Allergies  Allergen Reactions   Hydrocodone Nausea And Vomiting    Has taken Oxycodone and done well     Past Medical History:  Diagnosis Date   Osteopenia 02/05/2018   ASSESSMENT: The BMD measured at Femur Neck Left is 0.775 g/cm2 with a T-score of -1.9. This patient is considered osteopenic according to [facility]) criteria. The scan quality is good.  02/05/18    Thyroid disease     Past Surgical History:  Procedure Laterality Date   BREAST SURGERY     ELBOW FRACTURE SURGERY     WRIST SURGERY Right 2017    Social History   Socioeconomic History   Marital status: Married    Spouse name: Not on file   Number of children: Not on file   Years of education: Not on file   Highest education level: Not on file  Occupational History   Not on file  Tobacco Use   Smoking status: Never   Smokeless tobacco: Never  Vaping Use   Vaping Use: Never used  Substance and Sexual Activity   Alcohol use: No    Alcohol/week: 0.0 standard drinks of alcohol   Drug use: Not on file   Sexual activity: Yes    Partners: Male  Other Topics Concern   Not on file  Social History Narrative   Not on file   Social Determinants of Health   Financial Resource Strain: Not on file  Food Insecurity: Not on file  Transportation Needs: Not on file  Physical Activity: Sufficiently Active (12/29/2017)   Exercise Vital Sign    Days  of Exercise per Week: 7 days    Minutes of Exercise per Session: 40 min  Stress: Not on file  Social Connections: Not on file    Family History  Problem Relation Age of Onset   Depression Mother    Diabetes Father    Hyperlipidemia Father    Hypertension Father    Hearing loss Maternal Grandfather     Health Maintenance  Topic Date Due   PAP SMEAR-Modifier  05/09/2022 (Originally 11/17/2017)   COVID-19 Vaccine (1) 05/09/2022 (Originally 11/16/1970)   Zoster Vaccines- Shingrix (1 of 2) 05/17/2022 (Originally 05/19/2018)   INFLUENZA VACCINE  07/07/2022 (Originally 11/06/2021)   MAMMOGRAM  02/15/2023 (Originally 05/19/2018)   Hepatitis C Screening  02/15/2023 (Originally 05/19/1986)   Fecal DNA (Cologuard)  02/15/2023 (Originally 05/19/2013)   DTaP/Tdap/Td (2 - Td or Tdap) 01/30/2025   HIV Screening  Completed   HPV VACCINES  Aged Out     ----------------------------------------------------------------------------------------------------------------------------------------------------------------------------------------------------------------- Physical Exam BP 110/75 (BP Location: Left Arm, Patient Position: Sitting, Cuff Size: Normal)   Pulse 82   Ht 5\' 7"  (1.702 m)   Wt 180 lb (81.6 kg)   SpO2 96%   BMI 28.19 kg/m   Physical Exam Constitutional:      Appearance: Normal appearance.  HENT:     Head: Normocephalic and atraumatic.     Right Ear: Tympanic membrane normal.     Left Ear: Tympanic membrane normal.     Ears:     Comments: PE tube is out of place and sitting at the edge of the ear canal.  Eyes:     General: No scleral icterus. Cardiovascular:     Rate and Rhythm: Normal rate and regular rhythm.  Pulmonary:     Effort: Pulmonary effort is normal.     Breath sounds: Normal breath sounds.  Musculoskeletal:     Cervical back: Neck supple.  Neurological:     General: No focal deficit present.     Mental Status: She is alert.  Psychiatric:        Mood and  Affect: Mood normal.        Behavior: Behavior normal.     ------------------------------------------------------------------------------------------------------------------------------------------------------------------------------------------------------------------- Assessment and Plan  Sinobronchitis She has had prolonged and worsening symptoms over the past 10 days.  Adding doxycycline and course of prednisone.  Albuterol PRN for wheezing.  Red flags reviewed.  She will let me know if having worsening ear pain.     No orders of the defined types were placed in this encounter.   No follow-ups on file.    This visit occurred during the SARS-CoV-2 public health emergency.  Safety protocols were in place, including screening questions prior to the visit, additional usage of staff PPE, and extensive cleaning of exam room while observing appropriate contact time as indicated for disinfecting solutions.

## 2022-05-08 NOTE — Assessment & Plan Note (Signed)
She has had prolonged and worsening symptoms over the past 10 days.  Adding doxycycline and course of prednisone.  Albuterol PRN for wheezing.  Red flags reviewed.  She will let me know if having worsening ear pain.

## 2022-06-14 ENCOUNTER — Encounter: Payer: Self-pay | Admitting: Family Medicine

## 2022-06-14 ENCOUNTER — Ambulatory Visit (INDEPENDENT_AMBULATORY_CARE_PROVIDER_SITE_OTHER): Payer: 59 | Admitting: Family Medicine

## 2022-06-14 VITALS — BP 123/77 | HR 66 | Ht 67.0 in | Wt 184.0 lb

## 2022-06-14 DIAGNOSIS — H9202 Otalgia, left ear: Secondary | ICD-10-CM | POA: Insufficient documentation

## 2022-06-14 NOTE — Assessment & Plan Note (Addendum)
Small amount of serous fluid noted bilaterally.  TM appears intact without erythema.  May use ibuprofen as needed.  Continue flonase.  May use sudafed for a few days as well.  Instructed to let me know if worsening.

## 2022-06-14 NOTE — Progress Notes (Addendum)
Christy Burns - 52 y.o. female MRN [id number]  Date of birth: [birth year]  Subjective Chief Complaint  Patient presents with   Ear Pain    HPI Christy Burns is a 53 y.o. female here today with complaint of L ear.  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  Seen by ENT and recommend that she do pressure equalizing exercises rather than putting tube back in.  She was doing her PE exercises last night and felt pain in her L ear.  She has had persistent pain into today.  She denies changes to hearing or drainage from the ear.  No fever or chills. She does continue on flonase consistently.    ROS:  A comprehensive ROS was completed and negative except as noted per HPI  Allergies  Allergen Reactions   Hydrocodone Nausea And Vomiting    Has taken Oxycodone and done well     Past Medical History:  Diagnosis Date   Osteopenia 02/05/2018   ASSESSMENT: The BMD measured at Femur Neck Left is 0.775 g/cm2 with a T-score of -1.9. This patient is considered osteopenic according to [facility]) criteria. The scan quality is good.  02/05/18    Thyroid disease     Past Surgical History:  Procedure Laterality Date   BREAST SURGERY     ELBOW FRACTURE SURGERY     WRIST SURGERY Right 2017    Social History   Socioeconomic History   Marital status: Married    Spouse name: Not on file   Number of children: Not on file   Years of education: Not on file   Highest education level: Not on file  Occupational History   Not on file  Tobacco Use   Smoking status: Never   Smokeless tobacco: Never  Vaping Use   Vaping Use: Never used  Substance and Sexual Activity   Alcohol use: No    Alcohol/week: 0.0 standard drinks of alcohol   Drug use: Not on file   Sexual activity: Yes    Partners: Male  Other Topics Concern   Not on file  Social History Narrative   Not on file   Social Determinants of Health   Financial Resource Strain: Not  on file  Food Insecurity: Not on file  Transportation Needs: Not on file  Physical Activity: Sufficiently Active (12/29/2017)   Exercise Vital Sign    Days of Exercise per Week: 7 days    Minutes of Exercise per Session: 40 min  Stress: Not on file  Social Connections: Not on file    Family History  Problem Relation Age of Onset   Depression Mother    Diabetes Father    Hyperlipidemia Father    Hypertension Father    Hearing loss Maternal Grandfather     Health Maintenance  Topic Date Due   INFLUENZA VACCINE  07/07/2022 (Originally 11/06/2021)   PAP SMEAR-Modifier  09/14/2022 (Originally 11/17/2017)   Zoster Vaccines- Shingrix (1 of 2) 09/14/2022 (Originally 05/19/2018)   MAMMOGRAM  02/15/2023 (Originally 05/19/2018)   Hepatitis C Screening  02/15/2023 (Originally 05/19/1986)   Fecal DNA (Cologuard)  02/15/2023 (Originally 05/19/2013)   COVID-19 Vaccine (1) 06/30/2023 (Originally 11/16/1970)   DTaP/Tdap/Td (2 - Td or Tdap) 01/30/2025   HIV Screening  Completed   HPV VACCINES  Aged Out     ----------------------------------------------------------------------------------------------------------------------------------------------------------------------------------------------------------------- Physical Exam BP 123/77 (BP Location: Left Arm, Patient Position: Sitting, Cuff Size: Large)   Pulse 66   Ht  $'5\' 7"'g$  (1.702 m)   Wt 184 lb (83.5 kg)   SpO2 97%   BMI 28.82 kg/m   Physical Exam Constitutional:      Appearance: Normal appearance.  HENT:     Head: Normocephalic and atraumatic.     Ears:     Comments: Serous fluid bilaterally.  No erythema or bulging.   Eyes:     General: No scleral icterus. Cardiovascular:     Rate and Rhythm: Normal rate and regular rhythm.  Pulmonary:     Effort: Pulmonary effort is normal.     Breath sounds: Normal breath sounds.  Musculoskeletal:     Cervical back: Neck supple.  Neurological:     Mental Status: She is alert.  Psychiatric:         Mood and Affect: Mood normal.        Behavior: Behavior normal.     ------------------------------------------------------------------------------------------------------------------------------------------------------------------------------------------------------------------- Assessment and Plan  Left ear pain Small amount of serous fluid noted bilaterally.  TM appears intact without erythema.  May use ibuprofen as needed.  Continue flonase.  May use sudafed for a few days as well.  Instructed to let me know if worsening.     No orders of the defined types were placed in this encounter.   No follow-ups on file.    This visit occurred during the SARS-CoV-2 public health emergency.  Safety protocols were in place, including screening questions prior to the visit, additional usage of staff PPE, and extensive cleaning of exam room while observing appropriate contact time as indicated for disinfecting solutions.

## 2022-06-18 ENCOUNTER — Ambulatory Visit: Payer: 59 | Admitting: Family Medicine

## 2022-06-18 ENCOUNTER — Encounter: Payer: Self-pay | Admitting: Family Medicine

## 2022-06-18 MED ORDER — PREDNISONE 50 MG PO TABS: ORAL_TABLET | ORAL | 0 refills | Status: DC

## 2022-06-18 MED ORDER — AMOXICILLIN-POT CLAVULANATE 875-125 MG PO TABS: 1.0000 | ORAL_TABLET | Freq: Two times a day (BID) | ORAL | 0 refills | Status: DC

## 2022-06-18 NOTE — Addendum Note (Signed)
Addended by: Perlie Mayo on: 06/18/2022 04:51 PM   Modules accepted: Orders

## 2022-06-20 ENCOUNTER — Telehealth: Payer: Self-pay | Admitting: Family Medicine

## 2022-06-20 NOTE — Telephone Encounter (Signed)
I called pt back and left her a Voicemail stating Dr. Rodena Piety said it was okay her son to call back and schedule a new pt appt

## 2022-06-20 NOTE — Telephone Encounter (Signed)
Yes, that would be fine.

## 2022-06-20 NOTE — Telephone Encounter (Signed)
Pt called and states that her and her husband both see you and she would like to make her 52 year old son a New Pt Appt with Dr.Matthews? Please advise and I will be happy to call her back.

## 2022-07-04 IMAGING — DX DG FOOT COMPLETE 3+V*R*
3 series · 3 of 3 positions shown · non-contrast
Comparison: 07/07/2019

CLINICAL DATA: Pain at plantar surface of first and second
metatarsal for 2 weeks without trauma

EXAM:
RIGHT FOOT COMPLETE - 3+ VIEW

[foot ap]
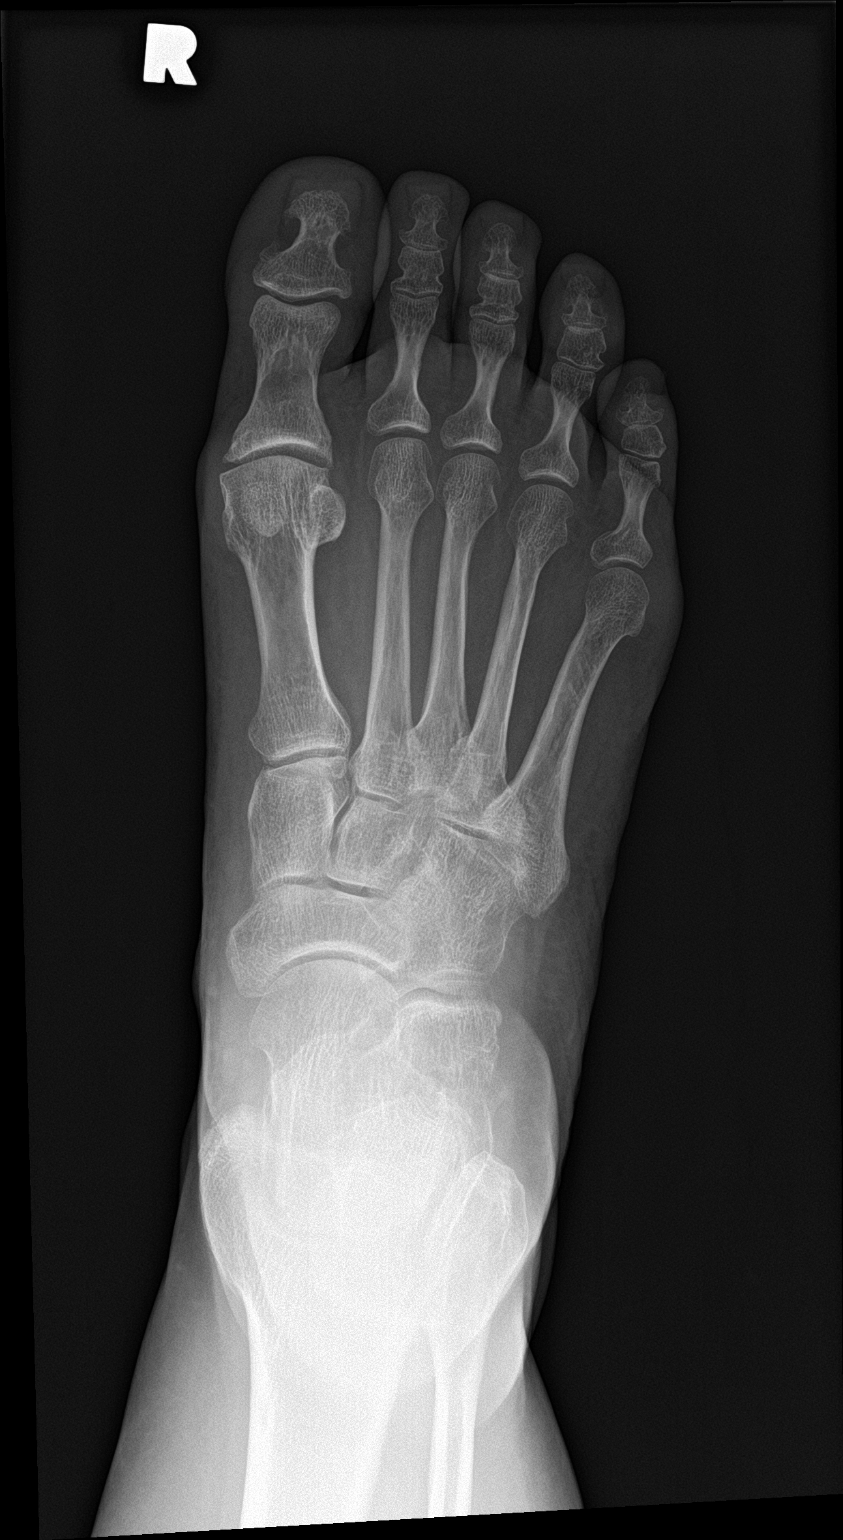

[foot obl]
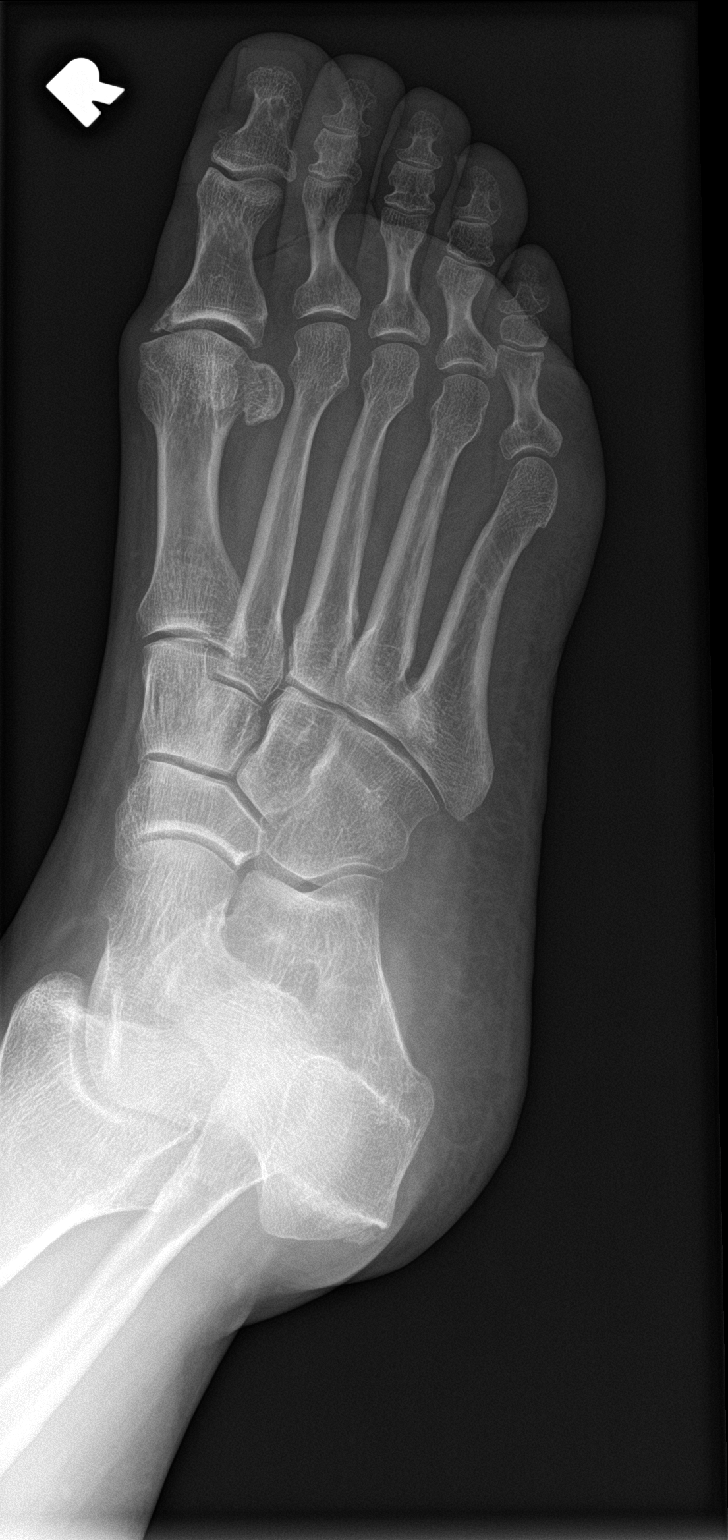

[foot lat]
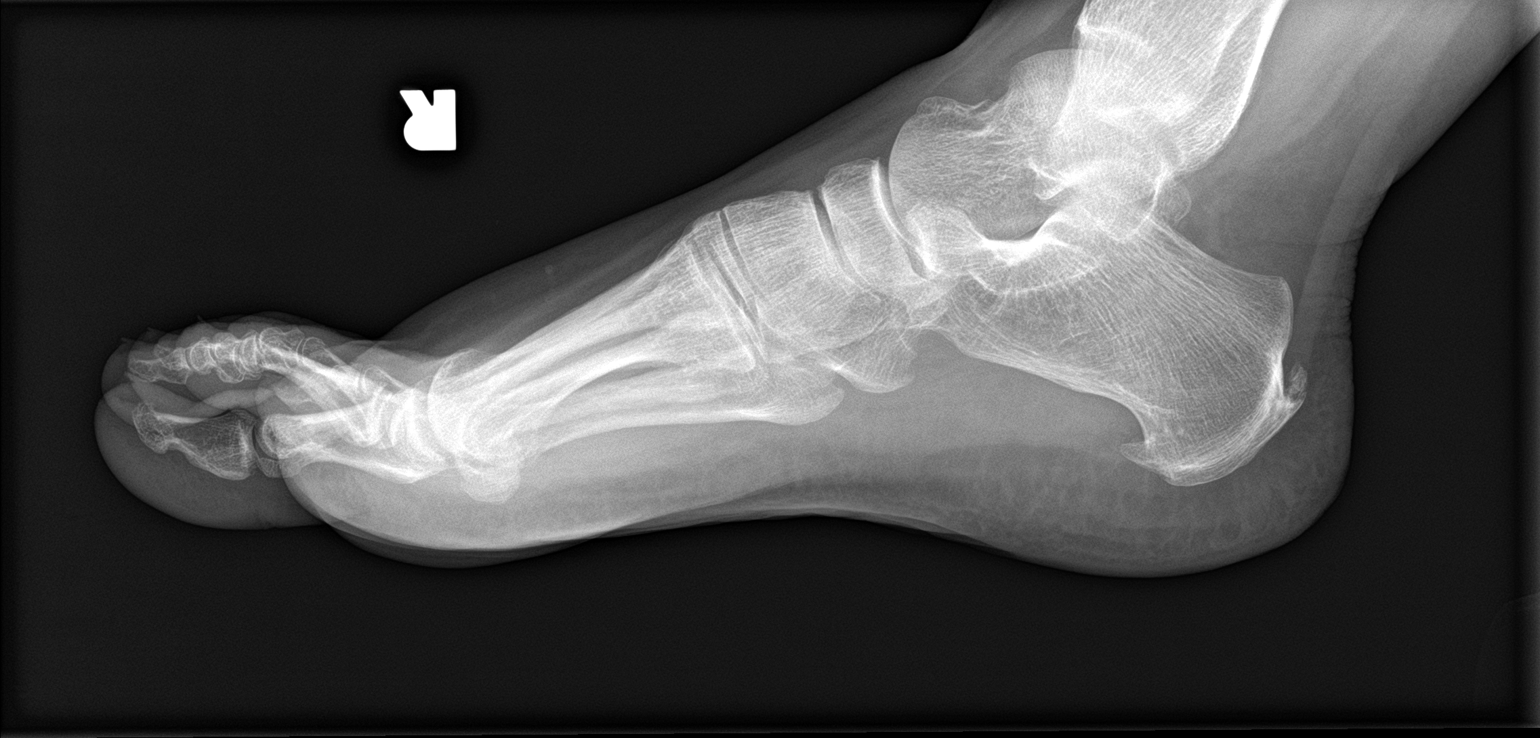

[3 of 3 positions shown; findings below may reference images not displayed]

FINDINGS: Again identified are degenerative changes of the first
metatarsophalangeal joint. No acute fracture or dislocation. No
periosteal reaction or callus deposition. Small Achilles and
calcaneal spurs. No radiopaque foreign object.
IMPRESSION: Degenerative change, without acute osseous finding.

## 2022-12-22 ENCOUNTER — Other Ambulatory Visit: Payer: Self-pay | Admitting: Family Medicine

## 2023-01-16 ENCOUNTER — Telehealth: Payer: Self-pay | Admitting: Family Medicine

## 2023-01-16 DIAGNOSIS — Z13220 Encounter for screening for lipoid disorders: Secondary | ICD-10-CM

## 2023-01-16 DIAGNOSIS — E559 Vitamin D deficiency, unspecified: Secondary | ICD-10-CM

## 2023-01-16 DIAGNOSIS — E039 Hypothyroidism, unspecified: Secondary | ICD-10-CM

## 2023-01-16 DIAGNOSIS — Z0000 Encounter for general adult medical examination without abnormal findings: Secondary | ICD-10-CM

## 2023-01-16 NOTE — Telephone Encounter (Signed)
Patient is requesting labs prior to physical scheduled October 24th

## 2023-01-16 NOTE — Telephone Encounter (Signed)
Orders entered

## 2023-01-28 LAB — CBC WITH DIFFERENTIAL/PLATELET
Basophils Absolute: 0 10*3/uL (ref 0.0–0.2)
Basos: 1 %
EOS (ABSOLUTE): 0 10*3/uL (ref 0.0–0.4)
Eos: 1 %
Hematocrit: 40.4 % (ref 34.0–46.6)
Hemoglobin: 13.5 g/dL (ref 11.1–15.9)
Immature Grans (Abs): 0 10*3/uL (ref 0.0–0.1)
Immature Granulocytes: 0 %
Lymphocytes Absolute: 1 10*3/uL (ref 0.7–3.1)
Lymphs: 24 %
MCH: 30.9 pg (ref 26.6–33.0)
MCHC: 33.4 g/dL (ref 31.5–35.7)
MCV: 92 fL (ref 79–97)
Monocytes Absolute: 0.4 10*3/uL (ref 0.1–0.9)
Monocytes: 9 %
Neutrophils Absolute: 2.8 10*3/uL (ref 1.4–7.0)
Neutrophils: 65 %
Platelets: 240 10*3/uL (ref 150–450)
RBC: 4.37 x10E6/uL (ref 3.77–5.28)
RDW: 11.8 % (ref 11.7–15.4)
WBC: 4.3 10*3/uL (ref 3.4–10.8)

## 2023-01-28 LAB — LIPID PANEL WITH LDL/HDL RATIO
Cholesterol, Total: 235 mg/dL — ABNORMAL HIGH (ref 100–199)
HDL: 73 mg/dL (ref >39)
LDL Chol Calc (NIH): 142 mg/dL — ABNORMAL HIGH (ref 0–99)
LDL/HDL Ratio: 1.9 {ratio} (ref 0.0–3.2)
Triglycerides: 113 mg/dL (ref 0–149)
VLDL Cholesterol Cal: 20 mg/dL (ref 5–40)

## 2023-01-28 LAB — CMP14+EGFR
ALT: 25 [IU]/L (ref 0–32)
AST: 15 [IU]/L (ref 0–40)
Albumin: 4.4 g/dL (ref 3.8–4.9)
Alkaline Phosphatase: 68 [IU]/L (ref 44–121)
BUN/Creatinine Ratio: 18 (ref 9–23)
BUN: 15 mg/dL (ref 6–24)
Bilirubin Total: 0.5 mg/dL (ref 0.0–1.2)
CO2: 23 mmol/L (ref 20–29)
Calcium: 9.8 mg/dL (ref 8.7–10.2)
Chloride: 102 mmol/L (ref 96–106)
Creatinine, Ser: 0.83 mg/dL (ref 0.57–1.00)
Globulin, Total: 2.3 g/dL (ref 1.5–4.5)
Glucose: 94 mg/dL (ref 70–99)
Potassium: 4.3 mmol/L (ref 3.5–5.2)
Sodium: 139 mmol/L (ref 134–144)
Total Protein: 6.7 g/dL (ref 6.0–8.5)
eGFR: 84 mL/min/{1.73_m2} (ref >59)

## 2023-01-28 LAB — TSH: TSH: 1.99 u[IU]/mL (ref 0.450–4.500)

## 2023-01-28 LAB — VITAMIN D 25 HYDROXY (VIT D DEFICIENCY, FRACTURES): Vit D, 25-Hydroxy: 37.8 ng/mL (ref 30.0–100.0)

## 2023-01-30 ENCOUNTER — Encounter: Payer: Self-pay | Admitting: Family Medicine

## 2023-01-30 ENCOUNTER — Ambulatory Visit (INDEPENDENT_AMBULATORY_CARE_PROVIDER_SITE_OTHER): Payer: 59 | Admitting: Family Medicine

## 2023-01-30 ENCOUNTER — Telehealth: Payer: Self-pay | Admitting: Family Medicine

## 2023-01-30 VITALS — BP 117/77 | HR 68 | Ht 67.0 in | Wt 183.0 lb

## 2023-01-30 DIAGNOSIS — Z1211 Encounter for screening for malignant neoplasm of colon: Secondary | ICD-10-CM

## 2023-01-30 DIAGNOSIS — E663 Overweight: Secondary | ICD-10-CM | POA: Insufficient documentation

## 2023-01-30 DIAGNOSIS — Z0000 Encounter for general adult medical examination without abnormal findings: Secondary | ICD-10-CM

## 2023-01-30 MED ORDER — PHENTERMINE HCL 37.5 MG PO CAPS: 37.5000 mg | ORAL_CAPSULE | ORAL | 2 refills | Status: DC

## 2023-01-30 NOTE — Addendum Note (Signed)
Addended by: Ardyth Man on: 01/30/2023 09:43 AM   Modules accepted: Orders

## 2023-01-30 NOTE — Patient Instructions (Signed)
Preventive Care 40-54 Years Old, Female Preventive care refers to lifestyle choices and visits with your health care provider that can promote health and wellness. Preventive care visits are also called wellness exams. What can I expect for my preventive care visit? Counseling Your health care provider may ask you questions about your: Medical history, including: Past medical problems. Family medical history. Pregnancy history. Current health, including: Menstrual cycle. Method of birth control. Emotional well-being. Home life and relationship well-being. Sexual activity and sexual health. Lifestyle, including: Alcohol, nicotine or tobacco, and drug use. Access to firearms. Diet, exercise, and sleep habits. Work and work environment. Sunscreen use. Safety issues such as seatbelt and bike helmet use. Physical exam Your health care provider will check your: Height and weight. These may be used to calculate your BMI (body mass index). BMI is a measurement that tells if you are at a healthy weight. Waist circumference. This measures the distance around your waistline. This measurement also tells if you are at a healthy weight and may help predict your risk of certain diseases, such as type 2 diabetes and high blood pressure. Heart rate and blood pressure. Body temperature. Skin for abnormal spots. What immunizations do I need?  Vaccines are usually given at various ages, according to a schedule. Your health care provider will recommend vaccines for you based on your age, medical history, and lifestyle or other factors, such as travel or where you work. What tests do I need? Screening Your health care provider may recommend screening tests for certain conditions. This may include: Lipid and cholesterol levels. Diabetes screening. This is done by checking your blood sugar (glucose) after you have not eaten for a while (fasting). Pelvic exam and Pap test. Hepatitis B test. Hepatitis C  test. HIV (human immunodeficiency virus) test. STI (sexually transmitted infection) testing, if you are at risk. Lung cancer screening. Colorectal cancer screening. Mammogram. Talk with your health care provider about when you should start having regular mammograms. This may depend on whether you have a family history of breast cancer. BRCA-related cancer screening. This may be done if you have a family history of breast, ovarian, tubal, or peritoneal cancers. Bone density scan. This is done to screen for osteoporosis. Talk with your health care provider about your test results, treatment options, and if necessary, the need for more tests. Follow these instructions at home: Eating and drinking  Eat a diet that includes fresh fruits and vegetables, whole grains, lean protein, and low-fat dairy products. Take vitamin and mineral supplements as recommended by your health care provider. Do not drink alcohol if: Your health care provider tells you not to drink. You are pregnant, may be pregnant, or are planning to become pregnant. If you drink alcohol: Limit how much you have to 0-1 drink a day. Know how much alcohol is in your drink. In the [place] one drink equals one 12 oz bottle of beer (355 mL), one 5 oz glass of wine (148 mL), or one 1 oz glass of hard liquor (44 mL). Lifestyle Brush your teeth every morning and night with fluoride toothpaste. Floss one time each day. Exercise for at least 30 minutes 5 or more days each week. Do not use any products that contain nicotine or tobacco. These products include cigarettes, chewing tobacco, and vaping devices, such as e-cigarettes. If you need help quitting, ask your health care provider. Do not use drugs. If you are sexually active, practice safe sex. Use a condom or other form of protection to   prevent STIs. If you do not wish to become pregnant, use a form of birth control. If you plan to become pregnant, see your health care provider for a  prepregnancy visit. Take aspirin only as told by your health care provider. Make sure that you understand how much to take and what form to take. Work with your health care provider to find out whether it is safe and beneficial for you to take aspirin daily. Find healthy ways to manage stress, such as: Meditation, yoga, or listening to music. Journaling. Talking to a trusted person. Spending time with friends and family. Minimize exposure to UV radiation to reduce your risk of skin cancer. Safety Always wear your seat belt while driving or riding in a vehicle. Do not drive: If you have been drinking alcohol. Do not ride with someone who has been drinking. When you are tired or distracted. While texting. If you have been using any mind-altering substances or drugs. Wear a helmet and other protective equipment during sports activities. If you have firearms in your house, make sure you follow all gun safety procedures. Seek help if you have been physically or sexually abused. What's next? Visit your health care provider once a year for an annual wellness visit. Ask your health care provider how often you should have your eyes and teeth checked. Stay up to date on all vaccines. This information is not intended to replace advice given to you by your health care provider. Make sure you discuss any questions you have with your health care provider. Document Revised: 09/20/2020 Document Reviewed: 09/20/2020 [employer] Patient Education  2024 [employer]  

## 2023-01-30 NOTE — Progress Notes (Signed)
Christy Burns - 52 y.o. female MRN [id number]  Date of birth: Jul 13, 1970  Subjective Chief Complaint  Patient presents with   Annual Exam    HPI Christy Burns is a 52 y.o. female here today for annual exam.  Reports that she is doing well  Had labs completed prior to visit which we reviewed today.   Activity and exercise are limited some by her chronic foot pain.   Her back has been bothering her some lately as well. She feels that her diet is pretty good  Non-smoker.  No EtOH use at this time.   Interested in trying medication for weight management.  She has done well with phentermine in the past.   Review of Systems  Constitutional:  Negative for chills, fever, malaise/fatigue and weight loss.  HENT:  Negative for congestion, ear pain and sore throat.   Eyes:  Negative for blurred vision, double vision and pain.  Respiratory:  Negative for cough and shortness of breath.   Cardiovascular:  Negative for chest pain and palpitations.  Gastrointestinal:  Negative for abdominal pain, blood in stool, constipation, heartburn and nausea.  Genitourinary:  Negative for dysuria and urgency.  Musculoskeletal:  Negative for joint pain and myalgias.  Neurological:  Negative for dizziness and headaches.  Endo/Heme/Allergies:  Does not bruise/bleed easily.  Psychiatric/Behavioral:  Negative for depression. The patient is not nervous/anxious and does not have insomnia.     Allergies  Allergen Reactions   Hydrocodone Nausea And Vomiting    Has taken Oxycodone and done well     Past Medical History:  Diagnosis Date   Osteopenia 02/05/2018   ASSESSMENT: The BMD measured at Femur Neck Left is 0.775 g/cm2 with a T-score of -1.9. This patient is considered osteopenic according to World Health Organization [facility]) criteria. The scan quality is good.  02/05/18    Thyroid disease     Past Surgical History:  Procedure Laterality Date   BREAST SURGERY     ELBOW FRACTURE SURGERY     WRIST  SURGERY Right 2017    Social History   Socioeconomic History   Marital status: Married    Spouse name: Not on file   Number of children: Not on file   Years of education: Not on file   Highest education level: Not on file  Occupational History   Not on file  Tobacco Use   Smoking status: Never   Smokeless tobacco: Never  Vaping Use   Vaping status: Never Used  Substance and Sexual Activity   Alcohol use: No    Alcohol/week: 0.0 standard drinks of alcohol   Drug use: Not on file   Sexual activity: Yes    Partners: Male  Other Topics Concern   Not on file  Social History Narrative   Not on file   Social Determinants of Health   Financial Resource Strain: Low Risk  (05/20/2022)   Received from [facility], [facility]   Overall Financial Resource Strain (CARDIA)    Difficulty of Paying Living Expenses: Not very hard  Food Insecurity: No Food Insecurity (05/20/2022)   Received from [facility], [facility]   Hunger Vital Sign    Worried About Running Out of Food in the Last Year: Never true    Ran Out of Food in the Last Year: Never true  Transportation Needs: No Transportation Needs (05/20/2022)   Received from [facility], [facility]   [facility] - Transportation    Lack of Transportation (Medical): No  Lack of Transportation (Non-Medical): No  Physical Activity: Insufficiently Active (05/20/2022)   Received from [facility], [facility]   Exercise Vital Sign    Days of Exercise per Week: 3 days    Minutes of Exercise per Session: 20 min  Stress: Stress Concern Present (05/20/2022)   Received from [facility], [facility] - Occupational Stress Questionnaire    Feeling of Stress : To some extent  Social Connections: Moderately Integrated (05/20/2022)   Received from [facility], [facility]   Social Network    How would you rate your social network (family, work, friends)?: Adequate participation with  social networks    Family History  Problem Relation Age of Onset   Depression Mother    Diabetes Father    Hyperlipidemia Father    Hypertension Father    Hearing loss Maternal Grandfather     Health Maintenance  Topic Date Due   Cervical Cancer Screening (HPV/Pap Cotest)  11/17/2017   MAMMOGRAM  02/15/2023 (Originally 05/19/2018)   COVID-19 Vaccine (1 - 2023-24 season) 02/15/2023 (Originally 12/08/2022)   Hepatitis C Screening  02/15/2023 (Originally 05/19/1986)   Fecal DNA (Cologuard)  02/15/2023 (Originally 05/19/2013)   Zoster Vaccines- Shingrix (1 of 2) 05/02/2023 (Originally 05/19/2018)   INFLUENZA VACCINE  07/07/2023 (Originally 11/07/2022)   DTaP/Tdap/Td (2 - Td or Tdap) 01/30/2025   HIV Screening  Completed   HPV VACCINES  Aged Out     ----------------------------------------------------------------------------------------------------------------------------------------------------------------------------------------------------------------- Physical Exam BP 117/77 (BP Location: Left Arm, Patient Position: Sitting, Cuff Size: Normal)   Pulse 68   Ht 5\' 7"  (1.702 m)   Wt 183 lb (83 kg)   SpO2 99%   BMI 28.66 kg/m   Physical Exam Constitutional:      General: She is not in acute distress. HENT:     Head: Normocephalic and atraumatic.     Right Ear: Tympanic membrane and ear canal normal.     Left Ear: Tympanic membrane and ear canal normal.     Nose: Nose normal.  Eyes:     General: No scleral icterus.    Conjunctiva/sclera: Conjunctivae normal.  Neck:     Thyroid: No thyromegaly.  Cardiovascular:     Rate and Rhythm: Normal rate and regular rhythm.     Heart sounds: Normal heart sounds.  Pulmonary:     Effort: Pulmonary effort is normal.     Breath sounds: Normal breath sounds.  Abdominal:     General: Bowel sounds are normal. There is no distension.     Palpations: Abdomen is soft.     Tenderness: There is no abdominal tenderness. There is no guarding.   Musculoskeletal:        General: Normal range of motion.     Cervical back: Normal range of motion and neck supple.  Lymphadenopathy:     Cervical: No cervical adenopathy.  Skin:    General: Skin is warm and dry.     Findings: No rash.  Neurological:     General: No focal deficit present.     Mental Status: She is alert and oriented to person, place, and time.     Cranial Nerves: No cranial nerve deficit.     Coordination: Coordination normal.  Psychiatric:        Mood and Affect: Mood normal.        Behavior: Behavior normal.     ------------------------------------------------------------------------------------------------------------------------------------------------------------------------------------------------------------------- Assessment and Plan  Well adult exam Well adult Recent labs reviewed with her.  Immunizations: She will get her flu vaccine at [facility] Screenings:  Pap completed at GYN.  Records requested.  Anticipatory guidance/Risk factor reduction:  Recommendations per AVS.   Overweight She has done well with phentermine in the past.  Will add this back on.     Meds ordered this encounter  Medications   phentermine 37.5 MG capsule    Sig: Take 1 capsule (37.5 mg total) by mouth every morning.    Dispense:  30 capsule    Refill:  2    No follow-ups on file.    This visit occurred during the SARS-CoV-2 public health emergency.  Safety protocols were in place, including screening questions prior to the visit, additional usage of staff PPE, and extensive cleaning of exam room while observing appropriate contact time as indicated for disinfecting solutions.

## 2023-01-30 NOTE — Telephone Encounter (Signed)
Completed.

## 2023-01-30 NOTE — Assessment & Plan Note (Signed)
She has done well with phentermine in the past.  Will add this back on.

## 2023-01-30 NOTE — Telephone Encounter (Signed)
Patient called stated [facility] in [place] doesn't have Phentermine 37.5 is out of stock she called [facility] in [place] and they have it can you resend it to them  Phone number is [phone]

## 2023-01-30 NOTE — Assessment & Plan Note (Addendum)
Well adult Recent labs reviewed with her.  Immunizations: She will get her flu vaccine at [facility] Screenings:  Pap completed at GYN.  Records requested.  Anticipatory guidance/Risk factor reduction:  Recommendations per AVS.

## 2023-02-28 MED ORDER — PHENTERMINE HCL 37.5 MG PO TABS: 37.5000 mg | ORAL_TABLET | Freq: Every day | ORAL | 0 refills | Status: DC

## 2023-02-28 NOTE — Addendum Note (Signed)
Addended by: Mammie Lorenzo on: 02/28/2023 12:11 PM   Modules accepted: Orders

## 2023-04-04 ENCOUNTER — Telehealth: Payer: Self-pay

## 2023-04-04 ENCOUNTER — Telehealth (INDEPENDENT_AMBULATORY_CARE_PROVIDER_SITE_OTHER): Payer: 59 | Admitting: Family Medicine

## 2023-04-04 ENCOUNTER — Encounter: Payer: Self-pay | Admitting: Family Medicine

## 2023-04-04 VITALS — BP 125/80 | HR 73 | Ht 67.0 in | Wt 183.0 lb

## 2023-04-04 DIAGNOSIS — J329 Chronic sinusitis, unspecified: Secondary | ICD-10-CM

## 2023-04-04 DIAGNOSIS — J40 Bronchitis, not specified as acute or chronic: Secondary | ICD-10-CM | POA: Diagnosis not present

## 2023-04-04 DIAGNOSIS — R051 Acute cough: Secondary | ICD-10-CM | POA: Diagnosis not present

## 2023-04-04 MED ORDER — GUAIFENESIN-CODEINE 100-10 MG/5ML PO SOLN: 5.0000 mL | Freq: Three times a day (TID) | ORAL | 0 refills | Status: DC | PRN

## 2023-04-04 MED ORDER — ALBUTEROL SULFATE HFA 108 (90 BASE) MCG/ACT IN AERS: 2.0000 | INHALATION_SPRAY | Freq: Four times a day (QID) | RESPIRATORY_TRACT | 0 refills | Status: DC | PRN

## 2023-04-04 MED ORDER — DOXYCYCLINE HYCLATE 100 MG PO TABS: 100.0000 mg | ORAL_TABLET | Freq: Two times a day (BID) | ORAL | 0 refills | Status: AC

## 2023-04-04 MED ORDER — METHYLPREDNISOLONE 4 MG PO TBPK: ORAL_TABLET | ORAL | 0 refills | Status: DC

## 2023-04-04 NOTE — Assessment & Plan Note (Signed)
Due to duration of symptoms do believe it is appropriate for patient to receive abx. Have sent in doxycycline for patient. Medrol dose pack also sent in to help decrease inflammation. Lung exam clear today with less concern for pna.

## 2023-04-04 NOTE — Progress Notes (Signed)
Acute Office Visit  Subjective:     Patient ID: Christy Burns, female    DOB: Dec 12, 1970, 52 y.o.   MRN: [id number]  Chief Complaint  Patient presents with   Cough    Congestion worse at night x2wks    Cough Pertinent negatives include no chest pain, chills, fever, headaches or shortness of breath.   Patient is in today for acute visit of worsening cough, congestion, and clogged ears that has been persistent for 2 weeks. She denies fever, chills. Does admit to persistent cough at night when she lays down that prevents her from sleeping.  Review of Systems  Constitutional:  Negative for chills and fever.  HENT:  Positive for congestion.   Respiratory:  Positive for cough. Negative for shortness of breath.   Cardiovascular:  Negative for chest pain.  Neurological:  Negative for headaches.        Objective:    BP 125/80 (BP Location: Left Arm, Patient Position: Sitting, Cuff Size: Large)   Pulse 73   Ht 5\' 7"  (1.702 m)   Wt 183 lb (83 kg)   SpO2 99%   BMI 28.66 kg/m    Physical Exam Vitals and nursing note reviewed.  Constitutional:      General: She is not in acute distress.    Appearance: Normal appearance.  HENT:     Head: Normocephalic and atraumatic.     Right Ear: Tympanic membrane, ear canal and external ear normal.     Left Ear: Tympanic membrane, ear canal and external ear normal.     Nose: Nose normal.  Eyes:     Conjunctiva/sclera: Conjunctivae normal.  Cardiovascular:     Rate and Rhythm: Normal rate and regular rhythm.  Pulmonary:     Effort: Pulmonary effort is normal.     Breath sounds: Normal breath sounds.  Neurological:     General: No focal deficit present.     Mental Status: She is alert and oriented to person, place, and time.  Psychiatric:        Mood and Affect: Mood normal.        Behavior: Behavior normal.        Thought Content: Thought content normal.        Judgment: Judgment normal.     No results found for any visits on  04/04/23.      Assessment & Plan:   Problem List Items Addressed This Visit       Respiratory   Sinobronchitis - Primary   Due to duration of symptoms do believe it is appropriate for patient to receive abx. Have sent in doxycycline for patient. Medrol dose pack also sent in to help decrease inflammation. Lung exam clear today with less concern for pna.      Relevant Medications   doxycycline (VIBRA-TABS) 100 MG tablet   guaiFENesin-codeine 100-10 MG/5ML syrup   methylPREDNISolone (MEDROL DOSEPAK) 4 MG TBPK tablet   albuterol (VENTOLIN HFA) 108 (90 Base) MCG/ACT inhaler     Other   Acute cough   Will go ahead and give albuterol inhaler as well as robitussin dm to help with cough at night - told pt if she gets any new or worsening symptoms to let us know       Meds ordered this encounter  Medications   doxycycline (VIBRA-TABS) 100 MG tablet    Sig: Take 1 tablet (100 mg total) by mouth 2 (two) times daily for 7 days.    Dispense:  14 tablet  Refill:  0   guaiFENesin-codeine 100-10 MG/5ML syrup    Sig: Take 5 mLs by mouth 3 (three) times daily as needed for cough.    Dispense:  240 mL    Refill:  0   methylPREDNISolone (MEDROL DOSEPAK) 4 MG TBPK tablet    Sig: Follow instructions on pill pack    Dispense:  21 tablet    Refill:  0   albuterol (VENTOLIN HFA) 108 (90 Base) MCG/ACT inhaler    Sig: Inhale 2 puffs into the lungs every 6 (six) hours as needed for wheezing or shortness of breath.    Dispense:  8 g    Refill:  0    No follow-ups on file.  Charlton Amor, DO

## 2023-04-04 NOTE — Assessment & Plan Note (Signed)
Will go ahead and give albuterol inhaler as well as robitussin dm to help with cough at night - told pt if she gets any new or worsening symptoms to let us know

## 2023-04-07 NOTE — Telephone Encounter (Signed)
Original note has disappeared from the system.

## 2023-05-21 ENCOUNTER — Other Ambulatory Visit: Payer: Self-pay | Admitting: Family Medicine

## 2023-05-21 NOTE — Telephone Encounter (Signed)
Levothyroxine already sent to the pharmacy. She should have refills. The phentermine cannot be refilled without an office visit.

## 2023-05-21 NOTE — Telephone Encounter (Signed)
Copied from CRM [id number]. Topic: Clinical - Medication Refill >> May 21, 2023  9:41 AM Geroge Baseman wrote: Most Recent Primary Care Visit:  Provider: Charlton Amor  Department: [facility]  Visit Type: OFFICE VISIT  Date: 04/04/2023  Medication: levothyroxine (SYNTHROID) 88 MCG tablet, phentermine (ADIPEX-P) 37.5 MG tablet  Has the patient contacted their pharmacy? Yes [facility] calling on behalf of patient  Is this the correct pharmacy for this prescription? Yes If no, delete pharmacy and type the correct one.  This is the patient's preferred pharmacy:  [facility] [id number] - [place], [place] - [address] [place] [address] Phone: [phone] Fax: [phone]   Has the prescription been filled recently? Yes  Is the patient out of the medication? Yes, the levothyroxine  Has the patient been seen for an appointment in the last year OR does the patient have an upcoming appointment? Yes  Can we respond through MyChart? No  Agent: Please be advised that Rx refills may take up to 3 business days. We ask that you follow-up with your pharmacy.

## 2023-05-21 NOTE — Telephone Encounter (Signed)
Last Fill: Synthroid: 12/23/22     Phentermine: 02/28/23  Last OV: 04/04/23 Next OV: None Scheduled  Routing to provider for review/authorization.

## 2023-05-23 ENCOUNTER — Ambulatory Visit: Payer: 59

## 2023-05-23 ENCOUNTER — Ambulatory Visit: Payer: Self-pay | Admitting: Family Medicine

## 2023-05-23 NOTE — Telephone Encounter (Signed)
  Chief Complaint: left ear pain Symptoms: pain in ear, feels full Frequency: constant Pertinent Negatives: Patient denies uri symptoms, allergies Disposition: [] ED /[x] Urgent Care (no appt availability in office) / [] Appointment(In office/virtual)/ []  [facility]/ [] Home Care/ [] Refused Recommended Disposition /[] [facility]/ []  Follow-up with PCP Additional Notes: states symptoms started yesterday, no apt available today, sent to UC and instructed to go to er if becomes worse.  Pcp office updated.  Reason for Disposition  Ear congestion present > 48 hours  Answer Assessment - Initial Assessment Questions 1. LOCATION: "Which ear is involved?"       Left ear pain started yesterday 2. SENSATION: "Describe how the ear feels." (e.g. stuffy, full, plugged)."      fullness 3. ONSET:  "When did the ear symptoms start?"       yesterday 4. PAIN: "Do you also have an earache?" If Yes, ask: "How bad is it?" (Scale 1-10; or mild, moderate, severe)     "Just uncomfortable" 5. CAUSE: "What do you think is causing the ear congestion?"     unknown 6. URI: "Do you have a runny nose or cough?"      denies 7. NASAL ALLERGIES: "Are there symptoms of hay fever, such as sneezing or a clear nasal discharge?"     denies 8. PREGNANCY: "Is there any chance you are pregnant?" "When was your last menstrual period?"     denies  Protocols used: Ear - Congestion-A-AH

## 2023-05-26 ENCOUNTER — Ambulatory Visit: Payer: 59 | Admitting: Physician Assistant

## 2023-06-17 ENCOUNTER — Ambulatory Visit (INDEPENDENT_AMBULATORY_CARE_PROVIDER_SITE_OTHER): Admitting: Medical-Surgical

## 2023-06-17 ENCOUNTER — Encounter: Payer: Self-pay | Admitting: Medical-Surgical

## 2023-06-17 VITALS — BP 123/85 | HR 85 | Temp 98.0°F | Ht 67.0 in | Wt 185.0 lb

## 2023-06-17 DIAGNOSIS — H66001 Acute suppurative otitis media without spontaneous rupture of ear drum, right ear: Secondary | ICD-10-CM | POA: Diagnosis not present

## 2023-06-17 MED ORDER — AMOXICILLIN-POT CLAVULANATE 875-125 MG PO TABS: 1.0000 | ORAL_TABLET | Freq: Two times a day (BID) | ORAL | 0 refills | Status: DC

## 2023-06-17 MED ORDER — PREDNISONE 50 MG PO TABS
50.0000 mg | ORAL_TABLET | Freq: Every day | ORAL | 0 refills | Status: DC
Start: 2023-06-17 — End: 2024-02-09

## 2023-06-17 NOTE — Progress Notes (Signed)
 Subjective:  Patient ID: Christy Burns, female    DOB: [birth year], 53 y.o.   MRN: [id number]  Patient Care Team: Everrett Coombe, DO as PCP - General (Family Medicine)   Chief Complaint:  Ear Pain (Rt ear pain onset  2 week , pt has hx of frequent ear infections )   HPI:  Christy Burns is a 53 y.o. female presenting on 06/17/2023 for Ear Pain (Rt ear pain onset  2 week , pt has hx of frequent ear infections )  History, Exam,  Impression and Plan  Right acute suppurative otitis media Presents with right ear pain and fullness that started 4 days ago.  Denies pain at this time but states that hearing and ear feels stuffy and full.  Patient has extensive history of right ear infections.  Had right TM drains placed approximately 2 years ago and last drain fell out February 2024.  Denies history of dizziness vertigo or drainage from right ear.  Reports treatment for bacterial rhinosinusitis September 2024 and she was given Augmentin and prednisone states " they worked well and cleared my ear up before the Augmentin was done".  On exam left TM is pearly gray without edema or erythema with crisp light reflex.  Right ear TM with erythema behind TM TM has visible scarring light reflex there is diffuse and not focused and TM is bulging.   Patient takes over-the-counter cetirizine and discussed alternating cetirizine with Xyzal to improve antihistamine effects going into allergy season in the weeks and months ahead. Given patient exam findings, history of eustachian tube dysfunction, and  recurrent middle ear infections suspect bacterial etiology will start Augmentin and prednisone.   -     amoxicillin-clavulanate (AUGMENTIN) 875-125 MG tablet; Take 1 tablet by mouth 2 (two) times daily. -     predniSONE (DELTASONE) 50 MG tablet; Take 1 tablet (50 mg total) by mouth daily with breakfast.  Continue all other maintenance medications.  Follow up plan: Return if symptoms worsen or fail to  improve.   Relevant past medical, surgical, family, and social history reviewed and updated as indicated.  Allergies and medications reviewed and updated. Data reviewed: Chart in Epic.   Past Medical History:  Diagnosis Date   Osteopenia 02/05/2018   ASSESSMENT: The BMD measured at Femur Neck Left is 0.775 g/cm2 with a T-score of -1.9. This patient is considered osteopenic according to World Health Organization [facility]) criteria. The scan quality is good.  02/05/18    Thyroid disease     Past Surgical History:  Procedure Laterality Date   BREAST SURGERY     ELBOW FRACTURE SURGERY     WRIST SURGERY Right 2017    Social History   Socioeconomic History   Marital status: Married    Spouse name: Not on file   Number of children: Not on file   Years of education: Not on file   Highest education level: Not on file  Occupational History   Not on file  Tobacco Use   Smoking status: Never   Smokeless tobacco: Never  Vaping Use   Vaping status: Never Used  Substance and Sexual Activity   Alcohol use: No    Alcohol/week: 0.0 standard drinks of alcohol   Drug use: Not on file   Sexual activity: Yes    Partners: Male  Other Topics Concern   Not on file  Social History Narrative   Not on file   Social Drivers of Health   Financial Resource Strain:  Low Risk  (05/20/2022)   Received from [facility], [facility]   Overall Financial Resource Strain (CARDIA)    Difficulty of Paying Living Expenses: Not very hard  Food Insecurity: No Food Insecurity (05/20/2022)   Received from [facility], [facility]   Hunger Vital Sign    Worried About Running Out of Food in the Last Year: Never true    Ran Out of Food in the Last Year: Never true  Transportation Needs: No Transportation Needs (05/20/2022)   Received [facility], [facility]   PRAPARE - Transportation    Lack of Transportation (Medical): No    Lack of Transportation (Non-Medical): No  Physical Activity:  Insufficiently Active (05/20/2022)   Received from [facility], [facility]   Exercise Vital Sign    Days of Exercise per Week: 3 days    Minutes of Exercise per Session: 20 min  Stress: Stress Concern Present (05/20/2022)   Received from [facility], [facility] of Occupational Health - Occupational Stress Questionnaire    Feeling of Stress : To some extent  Social Connections: Moderately Integrated (05/20/2022)   Received from [facility], [facility]   Social Network    How would you rate your social network (family, work, friends)?: Adequate participation with social networks  Intimate Partner Violence: Not At Risk (05/20/2022)   Received from [facility], [facility]   HITS    Over the last 12 months how often did your partner physically hurt you?: Never    Over the last 12 months how often did your partner insult you or talk down to you?: Rarely    Over the last 12 months how often did your partner threaten you with physical harm?: Never    Over the last 12 months how often did your partner scream or curse at you?: Rarely    Outpatient Encounter Medications as of 06/17/2023  Medication Sig   albuterol (VENTOLIN HFA) 108 (90 Base) MCG/ACT inhaler Inhale 2 puffs into the lungs every 6 (six) hours as needed for wheezing or shortness of breath.   amoxicillin-clavulanate (AUGMENTIN) 875-125 MG tablet Take 1 tablet by mouth 2 (two) times daily.   Cholecalciferol (VITAMIN D-3) 125 MCG (5000 UT) TABS Take by mouth.   fluticasone (FLONASE) 50 MCG/ACT nasal spray Place 2 sprays into both nostrils daily.   guaiFENesin-codeine 100-10 MG/5ML syrup Take 5 mLs by mouth 3 (three) times daily as needed for cough.   levothyroxine (SYNTHROID) 88 MCG tablet TAKE 1 TABLET BY MOUTH EVERY DAY   loratadine (CLARITIN) 10 MG tablet TAKE 1 TABLET (10 MG TOTAL) BY MOUTH DAILY.   phentermine (ADIPEX-P) 37.5 MG tablet Take 1 tablet (37.5 mg total) by mouth daily before  breakfast.   predniSONE (DELTASONE) 50 MG tablet Take 1 tablet (50 mg total) by mouth daily with breakfast.   [DISCONTINUED] methylPREDNISolone (MEDROL DOSEPAK) 4 MG TBPK tablet Follow instructions on pill pack   No facility-administered encounter medications on file as of 06/17/2023.    Allergies  Allergen Reactions   Hydrocodone Nausea And Vomiting    Has taken Oxycodone and done well     Review of Systems      Objective:  BP 123/85   Pulse 85   Temp 98 F (36.7 C) (Oral)   Ht 5\' 7"  (1.702 m)   Wt 185 lb (83.9 kg)   SpO2 99%   BMI 28.98 kg/m    Wt Readings from Last 3 Encounters:  06/17/23  185 lb (83.9 kg)  04/04/23 183 lb (83 kg)  01/30/23 183 lb (83 kg)    Physical Exam  Results for orders placed or performed in visit on 01/16/23  CBC with Differential/Platelet   Collection Time: 01/27/23  8:51 AM  Result Value Ref Range   WBC 4.3 3.4 - 10.8 x10E3/uL   RBC 4.37 3.77 - 5.28 x10E6/uL   Hemoglobin 13.5 11.1 - 15.9 g/dL   Hematocrit 16.1 09.6 - 46.6 %   MCV 92 79 - 97 fL   MCH 30.9 26.6 - 33.0 pg   MCHC 33.4 31.5 - 35.7 g/dL   RDW 04.5 40.9 - 81.1 %   Platelets 240 150 - 450 x10E3/uL   Neutrophils 65 Not Estab. %   Lymphs 24 Not Estab. %   Monocytes 9 Not Estab. %   Eos 1 Not Estab. %   Basos 1 Not Estab. %   Neutrophils Absolute 2.8 1.4 - 7.0 x10E3/uL   Lymphocytes Absolute 1.0 0.7 - 3.1 x10E3/uL   Monocytes Absolute 0.4 0.1 - 0.9 x10E3/uL   EOS (ABSOLUTE) 0.0 0.0 - 0.4 x10E3/uL   Basophils Absolute 0.0 0.0 - 0.2 x10E3/uL   Immature Granulocytes 0 Not Estab. %   Immature Grans (Abs) 0.0 0.0 - 0.1 x10E3/uL  CMP14+EGFR   Collection Time: 01/27/23  8:51 AM  Result Value Ref Range   Glucose 94 70 - 99 mg/dL   BUN 15 6 - 24 mg/dL   Creatinine, Ser 9.14 0.57 - 1.00 mg/dL   eGFR 84 >78 GN/FAO/1.30   BUN/Creatinine Ratio 18 9 - 23   Sodium 139 134 - 144 mmol/L   Potassium 4.3 3.5 - 5.2 mmol/L   Chloride 102 96 - 106 mmol/L   CO2 23 20 - 29 mmol/L    Calcium 9.8 8.7 - 10.2 mg/dL   Total Protein 6.7 6.0 - 8.5 g/dL   Albumin 4.4 3.8 - 4.9 g/dL   Globulin, Total 2.3 1.5 - 4.5 g/dL   Bilirubin Total 0.5 0.0 - 1.2 mg/dL   Alkaline Phosphatase 68 44 - 121 IU/L   AST 15 0 - 40 IU/L   ALT 25 0 - 32 IU/L  Lipid Panel With LDL/HDL Ratio   Collection Time: 01/27/23  8:51 AM  Result Value Ref Range   Cholesterol, Total 235 (H) 100 - 199 mg/dL   Triglycerides 865 0 - 149 mg/dL   HDL 73 >78 mg/dL   VLDL Cholesterol Cal 20 5 - 40 mg/dL   LDL Chol Calc (NIH) 469 (H) 0 - 99 mg/dL   LDL/HDL Ratio 1.9 0.0 - 3.2 ratio  Vitamin D (25 hydroxy)   Collection Time: 01/27/23  8:51 AM  Result Value Ref Range   Vit D, 25-Hydroxy 37.8 30.0 - 100.0 ng/mL  TSH   Collection Time: 01/27/23  8:51 AM  Result Value Ref Range   TSH 1.990 0.450 - 4.500 uIU/mL       Pertinent labs & imaging results that were available during my care of the patient were reviewed by me and considered in my medical decision making.   Continue healthy lifestyle choices, including diet (rich in fruits, vegetables, and lean proteins, and low in salt and simple carbohydrates) and exercise (at least 30 minutes of moderate physical activity daily).  The above assessment and management plan was discussed with the patient. The patient verbalized understanding of and has agreed to the management plan. Patient is aware to call the clinic if they develop any new symptoms  or if symptoms persist or worsen. Patient is aware when to return to the clinic for a follow-up visit. Patient educated on when it is appropriate to go to the emergency department.   Maryelizabeth Kaufmann Student AGNP

## 2023-06-17 NOTE — Patient Instructions (Addendum)
 Marland Kitchen

## 2023-06-17 NOTE — Progress Notes (Signed)
 Medical screening examination/treatment was performed by qualified clinical staff member and as supervising provider I was immediately available for consultation/collaboration. I have reviewed documentation and agree with assessment and plan.  Thayer Ohm, DNP, APRN, FNP-BC [facility]

## 2023-10-14 ENCOUNTER — Ambulatory Visit (INDEPENDENT_AMBULATORY_CARE_PROVIDER_SITE_OTHER): Admitting: Family Medicine

## 2023-10-14 ENCOUNTER — Encounter: Payer: Self-pay | Admitting: Family Medicine

## 2023-10-14 VITALS — BP 131/86 | HR 76 | Ht 67.0 in | Wt 185.0 lb

## 2023-10-14 DIAGNOSIS — L290 Pruritus ani: Secondary | ICD-10-CM | POA: Insufficient documentation

## 2023-10-14 MED ORDER — HYDROCORTISONE (PERIANAL) 2.5 % EX CREA: 1.0000 | TOPICAL_CREAM | Freq: Two times a day (BID) | CUTANEOUS | 0 refills | Status: DC

## 2023-10-14 NOTE — Progress Notes (Signed)
 Christy Burns - 53 y.o. female MRN [id number]  Date of birth: July 19, 1970  Subjective Chief Complaint  Patient presents with   Possible Pinworms    HPI Christy Burns is a 53 y.o. female here today with complaint of loose stools and anal itching.  She hsa had this for a few days.  She has concerns about possible pinworms due to co-workers kids having these. She has had some loose stool for the past week.  Denies blood in the stool.  No NV.  Denies fever or chills.    ROS:  A comprehensive ROS was completed and negative except as noted per HPI  Allergies  Allergen Reactions   Hydrocodone Nausea And Vomiting    Has taken Oxycodone and done well     Past Medical History:  Diagnosis Date   Osteopenia 02/05/2018   ASSESSMENT: The BMD measured at Femur Neck Left is 0.775 g/cm2 with a T-score of -1.9. This patient is considered osteopenic according to World Health Organization [facility]) criteria. The scan quality is good.  02/05/18    Thyroid disease     Past Surgical History:  Procedure Laterality Date   BREAST SURGERY     ELBOW FRACTURE SURGERY     WRIST SURGERY Right 2017    Social History   Socioeconomic History   Marital status: Married    Spouse name: Not on file   Number of children: Not on file   Years of education: Not on file   Highest education level: Not on file  Occupational History   Not on file  Tobacco Use   Smoking status: Never   Smokeless tobacco: Never  Vaping Use   Vaping status: Never Used  Substance and Sexual Activity   Alcohol use: No    Alcohol/week: 0.0 standard drinks of alcohol   Drug use: Not on file   Sexual activity: Yes    Partners: Male  Other Topics Concern   Not on file  Social History Narrative   Not on file   Social Drivers of Health   Financial Resource Strain: Low Risk  (05/20/2022)   Received from [facility]   Overall Financial Resource Strain (CARDIA)    Difficulty of Paying Living Expenses: Not very hard  Food Insecurity:  No Food Insecurity (05/20/2022)   Received from [facility]   Hunger Vital Sign    Within the past 12 months, you worried that your food would run out before you got the money to buy more.: Never true    Within the past 12 months, the food you bought just didn't last and you didn't have money to get more.: Never true  Transportation Needs: No Transportation Needs (05/20/2022)   Received from [facility] - Transportation    Lack of Transportation (Medical): No    Lack of Transportation (Non-Medical): No  Physical Activity: Insufficiently Active (05/20/2022)   Received from [facility]   Exercise Vital Sign    On average, how many days per week do you engage in moderate to strenuous exercise (like a brisk walk)?: 3 days    On average, how many minutes do you engage in exercise at this level?: 20 min  Stress: Stress Concern Present (05/20/2022)   Received from [facility] of Occupational Health - Occupational Stress Questionnaire    Feeling of Stress : To some extent  Social Connections: Moderately Integrated (05/20/2022)   Received from [facility]   Social Network    How  would you rate your social network (family, work, friends)?: Adequate participation with social networks    Family History  Problem Relation Age of Onset   Depression Mother    Diabetes Father    Hyperlipidemia Father    Hypertension Father    Hearing loss Maternal Grandfather     Health Maintenance  Topic Date Due   Hepatitis C Screening  Never done   Hepatitis B Vaccines (1 of 3 - 19+ 3-dose series) Never done   Fecal DNA (Cologuard)  Never done   Cervical Cancer Screening (HPV/Pap Cotest)  11/17/2017   MAMMOGRAM  Never done   Zoster Vaccines- Shingrix (1 of 2) Never done   COVID-19 Vaccine (1 - 2024-25 season) Never done   INFLUENZA VACCINE  11/07/2023   DTaP/Tdap/Td (2 - Td or Tdap) 01/30/2025   HIV Screening  Completed   HPV VACCINES  Aged Out   Meningococcal B  Vaccine  Aged Out     ----------------------------------------------------------------------------------------------------------------------------------------------------------------------------------------------------------------- Physical Exam BP 131/86 (BP Location: Left Arm, Patient Position: Sitting, Cuff Size: Normal)   Pulse 76   Ht 5' 7 (1.702 m)   Wt 185 lb (83.9 kg)   SpO2 96%   BMI 28.98 kg/m   Physical Exam Constitutional:      Appearance: Normal appearance.  HENT:     Head: Normocephalic and atraumatic.  Cardiovascular:     Rate and Rhythm: Normal rate and regular rhythm.  Pulmonary:     Effort: Pulmonary effort is normal.     Breath sounds: Normal breath sounds.  Genitourinary:    Comments: There are a couple of irritated perianal piles present.   Neurological:     Mental Status: She is alert.     ------------------------------------------------------------------------------------------------------------------------------------------------------------------------------------------------------------------- Assessment and Plan  Pruritus ani Likely related to loose stools and pile irritation.  Will try topical anusol  cream.  Slide Prep sent for pinworm testing.    Meds ordered this encounter  Medications   hydrocortisone  (ANUSOL -HC) 2.5 % rectal cream    Sig: Place 1 Application rectally 2 (two) times daily.    Dispense:  30 g    Refill:  0    No follow-ups on file.

## 2023-10-14 NOTE — Assessment & Plan Note (Signed)
 Likely related to loose stools and pile irritation.  Will try topical anusol  cream.  Slide Prep sent for pinworm testing.

## 2023-10-16 LAB — PINWORM PREP: Pinworm Prep - Enterobius: NONE SEEN

## 2023-10-17 ENCOUNTER — Ambulatory Visit: Payer: Self-pay | Admitting: Family Medicine

## 2023-11-06 ENCOUNTER — Ambulatory Visit (INDEPENDENT_AMBULATORY_CARE_PROVIDER_SITE_OTHER): Admitting: Family Medicine

## 2023-11-06 ENCOUNTER — Encounter: Payer: Self-pay | Admitting: Family Medicine

## 2023-11-06 VITALS — BP 116/72 | HR 82 | Ht 67.0 in | Wt 185.0 lb

## 2023-11-06 DIAGNOSIS — L03317 Cellulitis of buttock: Secondary | ICD-10-CM | POA: Diagnosis not present

## 2023-11-06 DIAGNOSIS — L918 Other hypertrophic disorders of the skin: Secondary | ICD-10-CM | POA: Diagnosis not present

## 2023-11-06 MED ORDER — DOXYCYCLINE HYCLATE 100 MG PO TABS: 100.0000 mg | ORAL_TABLET | Freq: Two times a day (BID) | ORAL | 0 refills | Status: DC

## 2023-11-06 NOTE — Progress Notes (Signed)
   Acute Office Visit  Subjective:     Patient ID: Christy Burns, female    DOB: 05-30-1970, 53 y.o.   MRN: [id number]  No chief complaint on file.   HPI Patient is in today for blemish on buttock.  She is 2 lesions on her left buttock cheek for almost 18 years but in the last few days she think one of them may have gotten irritated or caught on her clothing it has been sore and tender just below the 2 lesions not directly around them.  No fevers chills or drainage.  ROS      Objective:    BP 116/72   Pulse 82   Ht 5' 7 (1.702 m)   Wt 185 lb (83.9 kg)   SpO2 99%   BMI 28.98 kg/m     Physical Exam  She has 2 lesions on the left buttock.  1 is more of a pink to chelated skin tag the second 1 is papular but more flat.  And just distal to that she has an erythematous area that is tender slightly warm and indurated.  But I do not see a enlarged pore or sebaceous cyst.  No active drainage or open wound.  No results found for any visits on 11/06/23.      Assessment & Plan:   Problem List Items Addressed This Visit   None Visit Diagnoses       Skin tag    -  Primary     Cellulitis of buttock          We did discuss removing the skin tag.  She would like to have that done today.  The second lesion is raised but more flat and not a true tag.  We did discuss coming back to have that done in the fall since it will take probably 2 weeks for that to completely heal.  She does have a red swollen indurated area on the buttock cheek below the skin tags.  Not near the rectal area.  I do not see an enlarged pore or sebum cyst.  But it is red we will go ahead and treat with doxycycline  if not improving over the next week please let us  know.  Skin Tag Removal Procedure Note Diagnosis: inflamed skin tags Location: buttock Informed Consent: Discussed risks (permanent scarring, infection, pain, bleeding, bruising, redness, and recurrence of the lesion) and benefits of the procedure,  as well as the alternatives. She is aware that skin tags are benign lesions, and their removal is often not considered medically necessary. Informed consent was obtained. Preparation: The area was prepared in a standard fashion. Anesthesia: Lidocaine 1% without epinephrine Procedure Details: Iris scissors were used to perform sharp removal. Aluminum chloride was applied for hemostasis. Ointment and bandage were applied where needed. The patient tolerated the procedure well. Total number of lesions treated: 1 Plan: The patient was instructed on post-op care. Recommend OTC analgesia as needed for pain.   Meds ordered this encounter  Medications   doxycycline  (VIBRA -TABS) 100 MG tablet    Sig: Take 1 tablet (100 mg total) by mouth 2 (two) times daily.    Dispense:  14 tablet    Refill:  0    No follow-ups on file.  Dorothyann Byars, MD

## 2023-11-06 NOTE — Patient Instructions (Addendum)
 Place a bandage over the area.  Try to keep on until tomorrow morning if possible.  After that okay to clean the area with antibacterial soap and water in the shower pat dry and place a small dab of Vaseline daily for the next 7 days.  You do not have to keep gauze over it unless you just feel like you need to protect the skin from clothing or irritation or sweat.  If you are concerned at all about how the wound is looking please let us  know.  We are happy to see you back in the fall to remove the second lesion.  The tender swollen area does not improve with doxycycline  then please let me know.

## 2023-11-12 ENCOUNTER — Encounter: Payer: Self-pay | Admitting: Family Medicine

## 2023-12-14 ENCOUNTER — Other Ambulatory Visit: Payer: Self-pay | Admitting: Family Medicine

## 2023-12-16 ENCOUNTER — Encounter: Payer: Self-pay | Admitting: Family Medicine

## 2023-12-17 MED ORDER — LEVOTHYROXINE SODIUM 88 MCG PO TABS: 88.0000 ug | ORAL_TABLET | Freq: Every day | ORAL | 0 refills | Status: DC

## 2024-01-19 ENCOUNTER — Other Ambulatory Visit: Payer: Self-pay | Admitting: Family Medicine

## 2024-01-22 ENCOUNTER — Telehealth: Payer: Self-pay

## 2024-01-22 DIAGNOSIS — Z1211 Encounter for screening for malignant neoplasm of colon: Secondary | ICD-10-CM

## 2024-01-22 NOTE — Telephone Encounter (Signed)
 Patient agreed to have the Cologuard. Order placed.

## 2024-02-03 ENCOUNTER — Encounter: Admitting: Family Medicine

## 2024-02-06 ENCOUNTER — Other Ambulatory Visit: Payer: Self-pay

## 2024-02-06 DIAGNOSIS — E785 Hyperlipidemia, unspecified: Secondary | ICD-10-CM

## 2024-02-06 DIAGNOSIS — Z0000 Encounter for general adult medical examination without abnormal findings: Secondary | ICD-10-CM

## 2024-02-06 DIAGNOSIS — E039 Hypothyroidism, unspecified: Secondary | ICD-10-CM

## 2024-02-06 DIAGNOSIS — E559 Vitamin D deficiency, unspecified: Secondary | ICD-10-CM

## 2024-02-07 LAB — CBC WITH DIFFERENTIAL/PLATELET
Basophils Absolute: 0 x10E3/uL (ref 0.0–0.2)
Basos: 1 %
EOS (ABSOLUTE): 0.1 x10E3/uL (ref 0.0–0.4)
Eos: 1 %
Hematocrit: 43.1 % (ref 34.0–46.6)
Hemoglobin: 14.3 g/dL (ref 11.1–15.9)
Immature Grans (Abs): 0 x10E3/uL (ref 0.0–0.1)
Immature Granulocytes: 0 %
Lymphocytes Absolute: 1.2 x10E3/uL (ref 0.7–3.1)
Lymphs: 28 %
MCH: 30.6 pg (ref 26.6–33.0)
MCHC: 33.2 g/dL (ref 31.5–35.7)
MCV: 92 fL (ref 79–97)
Monocytes Absolute: 0.5 x10E3/uL (ref 0.1–0.9)
Monocytes: 10 %
Neutrophils Absolute: 2.6 x10E3/uL (ref 1.4–7.0)
Neutrophils: 60 %
Platelets: 268 x10E3/uL (ref 150–450)
RBC: 4.67 x10E6/uL (ref 3.77–5.28)
RDW: 12.3 % (ref 11.7–15.4)
WBC: 4.3 x10E3/uL (ref 3.4–10.8)

## 2024-02-07 LAB — CMP14+EGFR
ALT: 27 IU/L (ref 0–32)
AST: 18 IU/L (ref 0–40)
Albumin: 4.8 g/dL (ref 3.8–4.9)
Alkaline Phosphatase: 75 IU/L (ref 49–135)
BUN/Creatinine Ratio: 18 (ref 9–23)
BUN: 13 mg/dL (ref 6–24)
Bilirubin Total: 0.7 mg/dL (ref 0.0–1.2)
CO2: 21 mmol/L (ref 20–29)
Calcium: 9.9 mg/dL (ref 8.7–10.2)
Chloride: 101 mmol/L (ref 96–106)
Creatinine, Ser: 0.74 mg/dL (ref 0.57–1.00)
Globulin, Total: 2.5 g/dL (ref 1.5–4.5)
Glucose: 100 mg/dL — ABNORMAL HIGH (ref 70–99)
Potassium: 4.9 mmol/L (ref 3.5–5.2)
Sodium: 137 mmol/L (ref 134–144)
Total Protein: 7.3 g/dL (ref 6.0–8.5)
eGFR: 95 mL/min/1.73 (ref >59)

## 2024-02-07 LAB — TSH: TSH: 2.22 u[IU]/mL (ref 0.450–4.500)

## 2024-02-07 LAB — LIPID PANEL
Chol/HDL Ratio: 3.1 ratio (ref 0.0–4.4)
Cholesterol, Total: 237 mg/dL — ABNORMAL HIGH (ref 100–199)
HDL: 77 mg/dL (ref >39)
LDL Chol Calc (NIH): 144 mg/dL — ABNORMAL HIGH (ref 0–99)
Triglycerides: 94 mg/dL (ref 0–149)
VLDL Cholesterol Cal: 16 mg/dL (ref 5–40)

## 2024-02-07 LAB — VITAMIN D 25 HYDROXY (VIT D DEFICIENCY, FRACTURES): Vit D, 25-Hydroxy: 20.5 ng/mL — ABNORMAL LOW (ref 30.0–100.0)

## 2024-02-09 ENCOUNTER — Encounter: Payer: Self-pay | Admitting: Family Medicine

## 2024-02-09 ENCOUNTER — Ambulatory Visit (INDEPENDENT_AMBULATORY_CARE_PROVIDER_SITE_OTHER): Admitting: Family Medicine

## 2024-02-09 VITALS — BP 126/86 | HR 148 | Ht 67.0 in | Wt 186.0 lb

## 2024-02-09 DIAGNOSIS — E559 Vitamin D deficiency, unspecified: Secondary | ICD-10-CM | POA: Diagnosis not present

## 2024-02-09 DIAGNOSIS — E039 Hypothyroidism, unspecified: Secondary | ICD-10-CM | POA: Diagnosis not present

## 2024-02-09 DIAGNOSIS — Z0000 Encounter for general adult medical examination without abnormal findings: Secondary | ICD-10-CM

## 2024-02-09 DIAGNOSIS — L989 Disorder of the skin and subcutaneous tissue, unspecified: Secondary | ICD-10-CM | POA: Diagnosis not present

## 2024-02-09 DIAGNOSIS — Z1211 Encounter for screening for malignant neoplasm of colon: Secondary | ICD-10-CM

## 2024-02-09 DIAGNOSIS — Z1231 Encounter for screening mammogram for malignant neoplasm of breast: Secondary | ICD-10-CM

## 2024-02-09 NOTE — Patient Instructions (Addendum)
 Start vitamin d  5000 international units  daily.    Preventive Care 60-55 Years Old, Female Preventive care refers to lifestyle choices and visits with your health care provider that can promote health and wellness. Preventive care visits are also called wellness exams. What can I expect for my preventive care visit? Counseling Your health care provider may ask you questions about your: Medical history, including: Past medical problems. Family medical history. Pregnancy history. Current health, including: Menstrual cycle. Method of birth control. Emotional well-being. Home life and relationship well-being. Sexual activity and sexual health. Lifestyle, including: Alcohol, nicotine or tobacco, and drug use. Access to firearms. Diet, exercise, and sleep habits. Work and work astronomer. Sunscreen use. Safety issues such as seatbelt and bike helmet use. Physical exam Your health care provider will check your: Height and weight. These may be used to calculate your BMI (body mass index). BMI is a measurement that tells if you are at a healthy weight. Waist circumference. This measures the distance around your waistline. This measurement also tells if you are at a healthy weight and may help predict your risk of certain diseases, such as type 2 diabetes and high blood pressure. Heart rate and blood pressure. Body temperature. Skin for abnormal spots. What immunizations do I need?  Vaccines are usually given at various ages, according to a schedule. Your health care provider will recommend vaccines for you based on your age, medical history, and lifestyle or other factors, such as travel or where you work. What tests do I need? Screening Your health care provider may recommend screening tests for certain conditions. This may include: Lipid and cholesterol levels. Diabetes screening. This is done by checking your blood sugar (glucose) after you have not eaten for a while  (fasting). Pelvic exam and Pap test. Hepatitis B test. Hepatitis C test. HIV (human immunodeficiency virus) test. STI (sexually transmitted infection) testing, if you are at risk. Lung cancer screening. Colorectal cancer screening. Mammogram. Talk with your health care provider about when you should start having regular mammograms. This may depend on whether you have a family history of breast cancer. BRCA-related cancer screening. This may be done if you have a family history of breast, ovarian, tubal, or peritoneal cancers. Bone density scan. This is done to screen for osteoporosis. Talk with your health care provider about your test results, treatment options, and if necessary, the need for more tests. Follow these instructions at home: Eating and drinking  Eat a diet that includes fresh fruits and vegetables, whole grains, lean protein, and low-fat dairy products. Take vitamin and mineral supplements as recommended by your health care provider. Do not drink alcohol if: Your health care provider tells you not to drink. You are pregnant, may be pregnant, or are planning to become pregnant. If you drink alcohol: Limit how much you have to 0-1 drink a day. Know how much alcohol is in your drink. In the [place] one drink equals one 12 oz bottle of beer (355 mL), one 5 oz glass of wine (148 mL), or one 1 oz glass of hard liquor (44 mL). Lifestyle Brush your teeth every morning and night with fluoride toothpaste. Floss one time each day. Exercise for at least 30 minutes 5 or more days each week. Do not use any products that contain nicotine or tobacco. These products include cigarettes, chewing tobacco, and vaping devices, such as e-cigarettes. If you need help quitting, ask your health care provider. Do not use drugs. If you are sexually active, practice  safe sex. Use a condom or other form of protection to prevent STIs. If you do not wish to become pregnant, use a form of birth control. If  you plan to become pregnant, see your health care provider for a prepregnancy visit. Take aspirin only as told by your health care provider. Make sure that you understand how much to take and what form to take. Work with your health care provider to find out whether it is safe and beneficial for you to take aspirin daily. Find healthy ways to manage stress, such as: Meditation, yoga, or listening to music. Journaling. Talking to a trusted person. Spending time with friends and family. Minimize exposure to UV radiation to reduce your risk of skin cancer. Safety Always wear your seat belt while driving or riding in a vehicle. Do not drive: If you have been drinking alcohol. Do not ride with someone who has been drinking. When you are tired or distracted. While texting. If you have been using any mind-altering substances or drugs. Wear a helmet and other protective equipment during sports activities. If you have firearms in your house, make sure you follow all gun safety procedures. Seek help if you have been physically or sexually abused. What's next? Visit your health care provider once a year for an annual wellness visit. Ask your health care provider how often you should have your eyes and teeth checked. Stay up to date on all vaccines. This information is not intended to replace advice given to you by your health care provider. Make sure you discuss any questions you have with your health care provider. Document Revised: 09/20/2020 Document Reviewed: 09/20/2020 Elsevier Patient Education  2024 [employer].

## 2024-02-09 NOTE — Assessment & Plan Note (Signed)
 Well adult Recent labs reviewed with her. Mammogram ordered.  Immunizations: She will get her flu vaccine at [employer] Screenings:  Pap completed at GYN.  Records requested.  Anticipatory guidance/Risk factor reduction:  Recommendations per AVS.

## 2024-02-09 NOTE — Progress Notes (Signed)
 Pt states she will schedule PAP SMEAR with new OB/GYN. Cologuard has been delivered.

## 2024-02-09 NOTE — Progress Notes (Signed)
 Christy Burns - 53 y.o. female MRN [id number]  Date of birth: 16-Sep-1970  Subjective Chief Complaint  Patient presents with   Annual Exam   skin tags    HPI Christy Burns is a 53 y.o. female here today for annual exam.   She reports that she is doing ok.. Continues to deal with chronic foot pain. Has seen multiple specialists for this.   She had labs completed prior to visit today.  Vitamin d  levels remain low.  LDL elevated at 144.    She reports that she is not as active as she would like to be due to ongoing foot pain.  She feels that she is doing pretty well with diet.   No nicotine products.  Denies EtOH use.   She has regular dental care.   Review of Systems  Constitutional:  Negative for chills, fever, malaise/fatigue and weight loss.  HENT:  Negative for congestion, ear pain and sore throat.   Eyes:  Negative for blurred vision, double vision and pain.  Respiratory:  Negative for cough and shortness of breath.   Cardiovascular:  Negative for chest pain and palpitations.  Gastrointestinal:  Negative for abdominal pain, blood in stool, constipation, heartburn and nausea.  Genitourinary:  Negative for dysuria and urgency.  Musculoskeletal:  Negative for joint pain and myalgias.  Neurological:  Negative for dizziness and headaches.  Endo/Heme/Allergies:  Does not bruise/bleed easily.  Psychiatric/Behavioral:  Negative for depression. The patient is not nervous/anxious and does not have insomnia.     Allergies  Allergen Reactions   Hydrocodone Nausea And Vomiting    Has taken Oxycodone and done well     Past Medical History:  Diagnosis Date   Osteopenia 02/05/2018   ASSESSMENT: The BMD measured at Femur Neck Left is 0.775 g/cm2 with a T-score of -1.9. This patient is considered osteopenic according to World Health Organization [facility]) criteria. The scan quality is good.  02/05/18    Thyroid disease     Past Surgical History:  Procedure Laterality Date   BREAST  SURGERY     ELBOW FRACTURE SURGERY     WRIST SURGERY Right 2017    Social History   Socioeconomic History   Marital status: Married    Spouse name: Not on file   Number of children: Not on file   Years of education: Not on file   Highest education level: Not on file  Occupational History   Not on file  Tobacco Use   Smoking status: Never   Smokeless tobacco: Never  Vaping Use   Vaping status: Never Used  Substance and Sexual Activity   Alcohol use: No    Alcohol/week: 0.0 standard drinks of alcohol   Drug use: Not on file   Sexual activity: Yes    Partners: Male  Other Topics Concern   Not on file  Social History Narrative   Not on file   Social Drivers of Health   Financial Resource Strain: Low Risk  (05/20/2022)   Received from [facility]   Overall Financial Resource Strain (CARDIA)    Difficulty of Paying Living Expenses: Not very hard  Food Insecurity: No Food Insecurity (05/20/2022)   Received from [facility]   Hunger Vital Sign    Within the past 12 months, you worried that your food would run out before you got the money to buy more.: Never true    Within the past 12 months, the food you bought just didn't last and you didn't have  money to get more.: Never true  Transportation Needs: No Transportation Needs (05/20/2022)   Received from [facility]   PRAPARE - Transportation    Lack of Transportation (Medical): No    Lack of Transportation (Non-Medical): No  Physical Activity: Insufficiently Active (05/20/2022)   Received from [facility]   Exercise Vital Sign    On average, how many days per week do you engage in moderate to strenuous exercise (like a brisk walk)?: 3 days    On average, how many minutes do you engage in exercise at this level?: 20 min  Stress: Stress Concern Present (05/20/2022)   Received from [facility] of Occupational Health - Occupational Stress Questionnaire    Feeling of Stress : To some extent  Social  Connections: Moderately Integrated (05/20/2022)   Received from [facility]   Social Network    How would you rate your social network (family, work, friends)?: Adequate participation with social networks    Family History  Problem Relation Age of Onset   Depression Mother    Diabetes Father    Hyperlipidemia Father    Hypertension Father    Hearing loss Maternal Grandfather     Health Maintenance  Topic Date Due   Mammogram  Never done   Fecal DNA (Cologuard)  Never done   Cervical Cancer Screening (HPV/Pap Cotest)  11/17/2017   Zoster Vaccines- Shingrix (1 of 2) 05/11/2024 (Originally 05/20/1987)   Influenza Vaccine  07/06/2024 (Originally 11/07/2023)   Pneumococcal Vaccine: 50+ Years (1 of 1 - PCV) 02/08/2025 (Originally 05/19/2018)   Hepatitis B Vaccines 19-59 Average Risk (1 of 3 - 19+ 3-dose series) 02/08/2025 (Originally 05/20/1987)   Hepatitis C Screening  02/08/2025 (Originally 05/19/1986)   COVID-19 Vaccine (1) 02/24/2025 (Originally 05/19/1973)   DTaP/Tdap/Td (2 - Td or Tdap) 01/30/2025   HIV Screening  Completed   HPV VACCINES  Aged Out   Meningococcal B Vaccine  Aged Out     ----------------------------------------------------------------------------------------------------------------------------------------------------------------------------------------------------------------- Physical Exam BP 126/86 (BP Location: Left Arm, Patient Position: Sitting, Cuff Size: Normal)   Pulse (!) 148   Ht 5' 7 (1.702 m)   Wt 186 lb (84.4 kg)   SpO2 98%   BMI 29.13 kg/m   Physical Exam Constitutional:      General: She is not in acute distress. HENT:     Head: Normocephalic and atraumatic.     Right Ear: Tympanic membrane and ear canal normal.     Left Ear: Tympanic membrane and ear canal normal.     Nose: Nose normal.  Eyes:     General: No scleral icterus.    Conjunctiva/sclera: Conjunctivae normal.  Neck:     Thyroid: No thyromegaly.  Cardiovascular:     Rate  and Rhythm: Normal rate and regular rhythm.     Heart sounds: Normal heart sounds.  Pulmonary:     Effort: Pulmonary effort is normal.     Breath sounds: Normal breath sounds.  Abdominal:     General: Bowel sounds are normal. There is no distension.     Palpations: Abdomen is soft.     Tenderness: There is no abdominal tenderness. There is no guarding.  Musculoskeletal:        General: Normal range of motion.     Cervical back: Normal range of motion and neck supple.  Lymphadenopathy:     Cervical: No cervical adenopathy.  Skin:    General: Skin is warm and dry.     Findings: No rash.  Neurological:     General: No focal deficit present.     Mental Status: She is alert and oriented to person, place, and time.     Cranial Nerves: No cranial nerve deficit.     Coordination: Coordination normal.  Psychiatric:        Mood and Affect: Mood normal.        Behavior: Behavior normal.     ------------------------------------------------------------------------------------------------------------------------------------------------------------------------------------------------------------------- Assessment and Plan  Well adult exam Well adult Recent labs reviewed with her. Mammogram ordered.  Immunizations: She will get her flu vaccine at [employer] Screenings:  Pap completed at GYN.  Records requested.  Anticipatory guidance/Risk factor reduction:  Recommendations per AVS.    No orders of the defined types were placed in this encounter.   No follow-ups on file.

## 2024-02-24 ENCOUNTER — Other Ambulatory Visit: Payer: Self-pay | Admitting: Family Medicine

## 2024-03-17 ENCOUNTER — Ambulatory Visit (INDEPENDENT_AMBULATORY_CARE_PROVIDER_SITE_OTHER): Admitting: Family Medicine

## 2024-03-17 ENCOUNTER — Ambulatory Visit: Payer: Self-pay

## 2024-03-17 ENCOUNTER — Encounter: Payer: Self-pay | Admitting: Family Medicine

## 2024-03-17 VITALS — BP 97/62 | HR 90 | Ht 67.0 in | Wt 187.0 lb

## 2024-03-17 DIAGNOSIS — J329 Chronic sinusitis, unspecified: Secondary | ICD-10-CM

## 2024-03-17 DIAGNOSIS — J40 Bronchitis, not specified as acute or chronic: Secondary | ICD-10-CM

## 2024-03-17 MED ORDER — PREDNISONE 20 MG PO TABS: 20.0000 mg | ORAL_TABLET | Freq: Two times a day (BID) | ORAL | 0 refills | Status: AC

## 2024-03-17 MED ORDER — DOXYCYCLINE HYCLATE 100 MG PO TABS: 100.0000 mg | ORAL_TABLET | Freq: Two times a day (BID) | ORAL | 0 refills | Status: AC

## 2024-03-17 NOTE — Telephone Encounter (Signed)
 FYI Only or Action Required?: FYI only for provider: appointment scheduled on 03/17/24.  Patient was last seen in primary care on 02/09/2024 by Alvia Bring, DO.  Called Nurse Triage reporting Wheezing and Sinusitis.  Symptoms began yesterday.  Interventions attempted: Nothing.  Symptoms are: unchanged.  Triage Disposition: See HCP Within 4 Hours (Or PCP Triage)  Patient/caregiver understands and will follow disposition?: Yes   Copied from CRM #[id number]. Topic: Clinical - Red Word Triage >> Mar 17, 2024  8:11 AM Tobias L wrote: Red Word that prompted transfer to Nurse Triage: SOB, phlegm in lungs, wheezing, sinuses stopped up Reason for Disposition  [1] MILD difficulty breathing (e.g., minimal/no SOB at rest, SOB with walking, pulse < 100) AND [2] NEW-onset or WORSE than normal  Answer Assessment - Initial Assessment Questions Scheduled 03/17/24.   Advised call back or 911 if symptoms worsen.  1. RESPIRATORY STATUS: Describe your breathing? (e.g., wheezing, shortness of breath, unable to speak, severe coughing)      Sob, wheezing; intermittent, cough; green phlegm, sinus stopped up 2. ONSET: When did this breathing problem begin?      yesterday 3. PATTERN Does the difficult breathing come and go, or has it been constant since it started?      Comes and  goes 4. SEVERITY: How bad is your breathing? (e.g., mild, moderate, severe)      mild 6. CARDIAC HISTORY: Do you have any history of heart disease? (e.g., heart attack, angina, bypass surgery, angioplasty)      no 7. LUNG HISTORY: Do you have any history of lung disease?  (e.g., pulmonary embolus, asthma, emphysema)     no 8. CAUSE: What do you think is causing the breathing problem?      cold 9. OTHER SYMPTOMS: Do you have any other symptoms? (e.g., chest pain, cough, dizziness, fever, runny nose) Nausea, right side of sinus above eye, nasal drainage, chest congestion denies HA, dizziness 12. TRAVEL: Have  you traveled out of the country in the last month? (e.g., travel history, exposures)       Sick family  Protocols used: Breathing Difficulty-A-AH

## 2024-03-17 NOTE — Assessment & Plan Note (Signed)
.  Continue supportive care at home.   Adding doxycycline  and course of prednisone .  Red flags reviewed.  She will let me know if having worsening symptoms.

## 2024-03-17 NOTE — Progress Notes (Signed)
 Christy Burns - 53 y.o. female MRN [id number]  Date of birth: 1970-05-10  Subjective Chief Complaint  Patient presents with   Sinusitis    HPI Christy Burns is a 53 y.o. female here today with complaint of cough, nasal congestion and sinus pressure, chest congestion and productive cough.  She did notice some wheezing last night as well.  Denies dyspnea, fever or chills.  Using sudafed and afrin currently.  Denies GI symptoms.   ROS:  A comprehensive ROS was completed and negative except as noted per HPI  Allergies  Allergen Reactions   Hydrocodone Nausea And Vomiting    Has taken Oxycodone and done well     Past Medical History:  Diagnosis Date   Osteopenia 02/05/2018   ASSESSMENT: The BMD measured at Femur Neck Left is 0.775 g/cm2 with a T-score of -1.9. This patient is considered osteopenic according to World Health Organization [facility]) criteria. The scan quality is good.  02/05/18    Thyroid disease     Past Surgical History:  Procedure Laterality Date   BREAST SURGERY     ELBOW FRACTURE SURGERY     WRIST SURGERY Right 2017    Social History   Socioeconomic History   Marital status: Married    Spouse name: Not on file   Number of children: Not on file   Years of education: Not on file   Highest education level: Not on file  Occupational History   Not on file  Tobacco Use   Smoking status: Never   Smokeless tobacco: Never  Vaping Use   Vaping status: Never Used  Substance and Sexual Activity   Alcohol use: No    Alcohol/week: 0.0 standard drinks of alcohol   Drug use: Not on file   Sexual activity: Yes    Partners: Male  Other Topics Concern   Not on file  Social History Narrative   Not on file   Social Drivers of Health   Financial Resource Strain: Low Risk (05/20/2022)   Received from [facility]   Overall Financial Resource Strain (CARDIA)    Difficulty of Paying Living Expenses: Not very hard  Food Insecurity: No Food Insecurity (05/20/2022)    Received from [facility]   Hunger Vital Sign    Within the past 12 months, you worried that your food would run out before you got the money to buy more.: Never true    Within the past 12 months, the food you bought just didn't last and you didn't have money to get more.: Never true  Transportation Needs: No Transportation Needs (05/20/2022)   Received from [facility] - Transportation    Lack of Transportation (Medical): No    Lack of Transportation (Non-Medical): No  Physical Activity: Insufficiently Active (05/20/2022)   Received from [facility]   Exercise Vital Sign    On average, how many days per week do you engage in moderate to strenuous exercise (like a brisk walk)?: 3 days    On average, how many minutes do you engage in exercise at this level?: 20 min  Stress: Stress Concern Present (05/20/2022)   Received from [facility] of Occupational Health - Occupational Stress Questionnaire    Feeling of Stress : To some extent  Social Connections: Moderately Integrated (05/20/2022)   Received from [facility]   Social Network    How would you rate your social network (family, work, friends)?: Adequate participation with social networks  Family History  Problem Relation Age of Onset   Depression Mother    Diabetes Father    Hyperlipidemia Father    Hypertension Father    Hearing loss Maternal Grandfather     Health Maintenance  Topic Date Due   Mammogram  Never done   Fecal DNA (Cologuard)  Never done   Cervical Cancer Screening (HPV/Pap Cotest)  11/17/2017   Zoster Vaccines- Shingrix (1 of 2) 05/11/2024 (Originally 05/20/1987)   Influenza Vaccine  07/06/2024 (Originally 11/07/2023)   Pneumococcal Vaccine: 50+ Years (1 of 1 - PCV) 02/08/2025 (Originally 05/19/2018)   Hepatitis B Vaccines 19-59 Average Risk (1 of 3 - 19+ 3-dose series) 02/08/2025 (Originally 05/20/1987)   Hepatitis C Screening  02/08/2025 (Originally 05/19/1986)   COVID-19  Vaccine (1) 02/24/2025 (Originally 05/19/1973)   DTaP/Tdap/Td (2 - Td or Tdap) 01/30/2025   HIV Screening  Completed   HPV VACCINES  Aged Out   Meningococcal B Vaccine  Aged Out     ----------------------------------------------------------------------------------------------------------------------------------------------------------------------------------------------------------------- Physical Exam BP 105/68 (BP Location: Left Arm, Patient Position: Sitting, Cuff Size: Normal)   Pulse 90   Ht 5' 7 (1.702 m)   Wt 187 lb (84.8 kg)   SpO2 97%   BMI 29.29 kg/m   Physical Exam Constitutional:      Appearance: Normal appearance.  HENT:     Head: Normocephalic and atraumatic.     Nose: Congestion present.     Mouth/Throat:     Mouth: Mucous membranes are moist.  Eyes:     Conjunctiva/sclera: Conjunctivae normal.  Cardiovascular:     Rate and Rhythm: Normal rate and regular rhythm.  Pulmonary:     Effort: Pulmonary effort is normal.     Breath sounds: Normal breath sounds.  Musculoskeletal:     Cervical back: Neck supple.  Lymphadenopathy:     Cervical: No cervical adenopathy.  Neurological:     General: No focal deficit present.  Psychiatric:        Mood and Affect: Mood normal.        Behavior: Behavior normal.     ------------------------------------------------------------------------------------------------------------------------------------------------------------------------------------------------------------------- Assessment and Plan  Sinobronchitis .Continue supportive care at home.   Adding doxycycline  and course of prednisone .  Red flags reviewed.  She will let me know if having worsening symptoms.    Meds ordered this encounter  Medications   predniSONE  (DELTASONE ) 20 MG tablet    Sig: Take 1 tablet (20 mg total) by mouth 2 (two) times daily with a meal for 5 days.    Dispense:  10 tablet    Refill:  0   doxycycline  (VIBRA -TABS) 100 MG tablet     Sig: Take 1 tablet (100 mg total) by mouth 2 (two) times daily.    Dispense:  20 tablet    Refill:  0    No follow-ups on file.

## 2024-03-17 NOTE — Telephone Encounter (Signed)
 Patient scheduled today 03/17/2024 with Dr. Alvia.

## 2024-03-24 ENCOUNTER — Ambulatory Visit: Payer: Self-pay

## 2024-03-24 NOTE — Telephone Encounter (Signed)
 FYI Only or Action Required?: FYI only for provider: ED advised.  Patient was last seen in primary care on 03/17/2024 by Alvia Bring, DO.  Called Nurse Triage reporting Foot Injury.  Symptoms began yesterday.  Interventions attempted: Nothing.  Symptoms are: unchanged.  Triage Disposition: Go to ED Now (Notify PCP)  Patient/caregiver understands and will follow disposition?: Yes    Copied from CRM [id number]. Topic: Clinical - Red Word Triage >> Mar 24, 2024  8:02 AM Aleatha C wrote: Red Word that prompted transfer to Nurse Triage: in lots of pain, fall and rolled foot Reason for Disposition  Can't stand (bear weight) or walk  Answer Assessment - Initial Assessment Questions Advised ED now. Pt reports will have someone to drive.  Advised 911 if symptoms worsen.  1. MECHANISM: How did the injury happen? (e.g., twisting injury, direct blow)      Tripped and rolled over on ankle 2. ONSET: When did the injury happen? (e.g., minutes or hours ago)      yesterday 3. LOCATION: Where is the injury located?      Right foot 5. WEIGHT-BEARING: Can you put weight on that foot? Can you walk (four steps or more)?    Cannot stand or bear weight on iy  Protocols used: Foot Injury-A-AH

## 2024-09-13 ENCOUNTER — Ambulatory Visit: Admitting: Physician Assistant
